# Patient Record
Sex: Female | Born: 1990 | Race: White | Hispanic: No | Smoking: Never smoker
Health system: Southern US, Community
[De-identification: ages and names within clinical notes are randomized; demographics above are authoritative.]

## PROBLEM LIST (undated history)

## (undated) ENCOUNTER — Inpatient Hospital Stay (HOSPITAL_COMMUNITY): Payer: Self-pay

## (undated) DIAGNOSIS — G43909 Migraine, unspecified, not intractable, without status migrainosus: Secondary | ICD-10-CM

## (undated) DIAGNOSIS — Z8619 Personal history of other infectious and parasitic diseases: Secondary | ICD-10-CM

## (undated) HISTORY — PX: DILATION AND CURETTAGE OF UTERUS: SHX78

---
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
## 2010-10-12 ENCOUNTER — Encounter: Payer: Self-pay | Admitting: Obstetrics and Gynecology

## 2011-05-08 ENCOUNTER — Inpatient Hospital Stay (HOSPITAL_COMMUNITY)
Admission: AD | Admit: 2011-05-08 | Discharge: 2011-05-08 | Disposition: A | Discharge status: Home / Self Care | Payer: Medicaid Other | Source: Ambulatory Visit | Attending: Obstetrics & Gynecology | Admitting: Obstetrics & Gynecology

## 2011-05-08 ENCOUNTER — Encounter (HOSPITAL_COMMUNITY): Payer: Self-pay

## 2011-05-08 DIAGNOSIS — O99891 Other specified diseases and conditions complicating pregnancy: Secondary | ICD-10-CM | POA: Insufficient documentation

## 2011-05-08 DIAGNOSIS — Z331 Pregnant state, incidental: Secondary | ICD-10-CM

## 2011-05-08 DIAGNOSIS — M79609 Pain in unspecified limb: Secondary | ICD-10-CM

## 2011-05-08 LAB — HEPATITIS B SURFACE ANTIGEN: Hepatitis B Surface Ag: NEGATIVE

## 2011-05-08 LAB — RUBELLA ANTIBODY, IGM: Rubella: IMMUNE

## 2011-05-08 LAB — ABO/RH: RH Type: NEGATIVE

## 2011-05-08 LAB — HIV ANTIBODY (ROUTINE TESTING W REFLEX): HIV: NONREACTIVE

## 2011-05-08 LAB — RPR: RPR: NONREACTIVE

## 2011-05-08 NOTE — ED Notes (Signed)
Portable ultrasound performed at bedside by Henrietta Hoover PA

## 2011-05-08 NOTE — ED Provider Notes (Addendum)
History   Pt presents today c/o grabbing and electric fence yesterday. She states she accidentally touched the fence while her other hand was in a bucket of water. The shock knocked her backwards but she did not hit the ground. She denies chest pain, SOB, abd pain, vag bleeding, or any other sx at this time. She states she was told to come to the MAU for an [place]. She also reports that she has had pain in her left calf. She states it "feels like there is a knot there." She denies erythema or edema.  No chief complaint on file.  HPI  OB History    Grav Para Term Preterm Abortions TAB SAB Ect Mult Living   3 1 1  1  1   1       Past Medical History  Diagnosis Date  . No pertinent past medical history     No past surgical history on file.  No family history on file.  History  Substance Use Topics  . Smoking status: Not on file  . Smokeless tobacco: Not on file  . Alcohol Use: Not on file    Allergies: Allergies not on file  No prescriptions prior to admission    Review of Systems  Constitutional: Negative for fever and chills.  Eyes: Negative for blurred vision.  Cardiovascular: Negative for chest pain and palpitations.  Gastrointestinal: Negative for nausea, vomiting, abdominal pain, diarrhea and constipation.  Genitourinary: Negative for dysuria, urgency, frequency, hematuria and flank pain.  Neurological: Negative for dizziness, tingling, seizures and headaches.  Psychiatric/Behavioral: Negative for depression and suicidal ideas.   Physical Exam   Blood pressure 106/63, pulse 80, temperature 98.4 F (36.9 C), temperature source Oral, resp. rate 16, height 5' 3.5" (1.613 m), weight 135 lb 3.2 oz (61.326 kg), SpO2 100.00%.  Physical Exam  Constitutional: She is oriented to person, place, and time. She appears well-developed and well-nourished. No distress.  HENT:  Head: Normocephalic and atraumatic.  Eyes: EOM are normal. Pupils are equal, round, and reactive to light.    GI: Soft. She exhibits no distension and no mass. There is no tenderness. There is no rebound and no guarding.  Musculoskeletal:       Left lower leg: She exhibits tenderness. She exhibits no swelling, no edema and no deformity.       Legs: Neurological: She is alert and oriented to person, place, and time.  Skin: Skin is warm and dry. She is not diaphoretic.  Psychiatric: She has a normal mood and affect. Her behavior is normal. Judgment and thought content normal.    MAU Course  Procedures  Discussed pt with Dr. Tamela Oddi. She advised to do bedside US for fetal viability. If NL then may have pt f/u in office.  Lower extremity doppler study ordered secondary to left calf pain.  Lower extremity doppler study was negative with no evidence of DVT.  Assessment and Plan  Pt has f/u scheduled. Discussed diet, activity, risks, and precautions.  Clinton Gallant. Aedyn Mckeon III, DrHSc, MPAS, PA-C  05/08/2011, 11:03 AM

## 2011-05-08 NOTE — Progress Notes (Signed)
Patient states that on 8-16 she was watering horses and grabbed an electric fence while her hand was in water. Was shocked and throw backwards. Felt like she had a "shock" in her heart. Went to the office today and was instructed to come to MAU and have an ultrasound. Pt is not having any problems at this time.

## 2011-05-11 ENCOUNTER — Other Ambulatory Visit: Payer: Self-pay

## 2011-05-11 ENCOUNTER — Observation Stay (HOSPITAL_COMMUNITY)
Admission: AD | Admit: 2011-05-11 | Discharge: 2011-05-14 | Disposition: A | Discharge status: Home / Self Care | Payer: Medicaid Other | Source: Ambulatory Visit | Attending: Obstetrics | Admitting: Obstetrics

## 2011-05-11 ENCOUNTER — Encounter (HOSPITAL_COMMUNITY): Payer: Self-pay | Admitting: *Deleted

## 2011-05-11 DIAGNOSIS — O26899 Other specified pregnancy related conditions, unspecified trimester: Secondary | ICD-10-CM | POA: Diagnosis present

## 2011-05-11 DIAGNOSIS — G43909 Migraine, unspecified, not intractable, without status migrainosus: Secondary | ICD-10-CM | POA: Insufficient documentation

## 2011-05-11 DIAGNOSIS — O99891 Other specified diseases and conditions complicating pregnancy: Principal | ICD-10-CM | POA: Insufficient documentation

## 2011-05-11 DIAGNOSIS — R519 Headache, unspecified: Secondary | ICD-10-CM

## 2011-05-11 HISTORY — DX: Migraine, unspecified, not intractable, without status migrainosus: G43.909

## 2011-05-11 LAB — COMPREHENSIVE METABOLIC PANEL
ALT: 7 U/L (ref 0–35)
AST: 14 U/L (ref 0–37)
Albumin: 3.2 g/dL — ABNORMAL LOW (ref 3.5–5.2)
Alkaline Phosphatase: 64 U/L (ref 39–117)
BUN: 6 mg/dL (ref 6–23)
CO2: 25 mEq/L (ref 19–32)
Calcium: 9.1 mg/dL (ref 8.4–10.5)
Chloride: 104 mEq/L (ref 96–112)
Creatinine, Ser: <0.47 mg/dL — ABNORMAL LOW (ref 0.50–1.10)
Glucose, Bld: 96 mg/dL (ref 70–99)
Potassium: 4 mEq/L (ref 3.5–5.1)
Sodium: 135 mEq/L (ref 135–145)
Total Bilirubin: 0.1 mg/dL — ABNORMAL LOW (ref 0.3–1.2)
Total Protein: 6.8 g/dL (ref 6.0–8.3)

## 2011-05-11 LAB — URINALYSIS, ROUTINE W REFLEX MICROSCOPIC
Bilirubin Urine: NEGATIVE
Glucose, UA: >1000 mg/dL — AB
Hgb urine dipstick: NEGATIVE
Ketones, ur: 40 mg/dL — AB
Nitrite: NEGATIVE
Protein, ur: NEGATIVE mg/dL
Specific Gravity, Urine: 1.015 (ref 1.005–1.030)
Urobilinogen, UA: 0.2 mg/dL (ref 0.0–1.0)
pH: 8.5 — ABNORMAL HIGH (ref 5.0–8.0)

## 2011-05-11 LAB — CBC
HCT: 34.5 % — ABNORMAL LOW (ref 36.0–46.0)
Hemoglobin: 11.9 g/dL — ABNORMAL LOW (ref 12.0–15.0)
MCH: 28.5 pg (ref 26.0–34.0)
MCHC: 34.5 g/dL (ref 30.0–36.0)
MCV: 82.7 fL (ref 78.0–100.0)
Platelets: 273 10*3/uL (ref 150–400)
RBC: 4.17 MIL/uL (ref 3.87–5.11)
RDW: 15 % (ref 11.5–15.5)
WBC: 12 10*3/uL — ABNORMAL HIGH (ref 4.0–10.5)

## 2011-05-11 LAB — URINE MICROSCOPIC-ADD ON

## 2011-05-11 MED ORDER — LACTATED RINGERS IV SOLN
INTRAVENOUS | Status: DC
  Administered 2011-05-11: 16:00:00 via INTRAVENOUS

## 2011-05-11 MED ORDER — BUTALBITAL-APAP-CAFFEINE 50-325-40 MG PO TABS
2.0000 | ORAL_TABLET | Freq: Four times a day (QID) | ORAL | Status: DC | PRN
  Administered 2011-05-12 – 2011-05-13 (×6): 2 via ORAL
  Filled 2011-05-11 (×3): qty 2

## 2011-05-11 MED ORDER — MORPHINE SULFATE 10 MG/ML IJ SOLN
10.0000 mg | Freq: Once | INTRAMUSCULAR | Status: AC
  Administered 2011-05-11: 10 mg via INTRAMUSCULAR
  Filled 2011-05-11: qty 1

## 2011-05-11 MED ORDER — DEXTROSE-NACL 5-0.45 % IV SOLN
INTRAVENOUS | Status: DC
  Administered 2011-05-11 – 2011-05-14 (×8): via INTRAVENOUS

## 2011-05-11 MED ORDER — ZOLPIDEM TARTRATE 10 MG PO TABS
10.0000 mg | ORAL_TABLET | Freq: Every evening | ORAL | Status: DC | PRN
  Administered 2011-05-11 – 2011-05-13 (×3): 10 mg via ORAL
  Filled 2011-05-11 (×2): qty 1

## 2011-05-11 MED ORDER — PRENATAL PLUS 27-1 MG PO TABS
1.0000 | ORAL_TABLET | Freq: Every day | ORAL | Status: DC
  Administered 2011-05-11 – 2011-05-14 (×4): 1 via ORAL
  Filled 2011-05-11 (×5): qty 1

## 2011-05-11 MED ORDER — DEXAMETHASONE SODIUM PHOSPHATE 10 MG/ML IJ SOLN
10.0000 mg | Freq: Once | INTRAMUSCULAR | Status: AC
  Administered 2011-05-11: 10 mg via INTRAVENOUS
  Filled 2011-05-11: qty 1

## 2011-05-11 MED ORDER — DOCUSATE SODIUM 100 MG PO CAPS
100.0000 mg | ORAL_CAPSULE | Freq: Every day | ORAL | Status: DC
  Administered 2011-05-11 – 2011-05-13 (×3): 100 mg via ORAL
  Filled 2011-05-11 (×6): qty 1

## 2011-05-11 MED ORDER — DIPHENHYDRAMINE HCL 50 MG/ML IJ SOLN
25.0000 mg | Freq: Once | INTRAMUSCULAR | Status: AC
  Administered 2011-05-11: 25 mg via INTRAVENOUS
  Filled 2011-05-11: qty 1

## 2011-05-11 MED ORDER — ACETAMINOPHEN 325 MG PO TABS
650.0000 mg | ORAL_TABLET | ORAL | Status: DC | PRN
  Administered 2011-05-11: 650 mg via ORAL
  Filled 2011-05-11 (×2): qty 2

## 2011-05-11 MED ORDER — CALCIUM CARBONATE ANTACID 500 MG PO CHEW
2.0000 | CHEWABLE_TABLET | ORAL | Status: DC | PRN
  Administered 2011-05-13: 400 mg via ORAL
  Filled 2011-05-11 (×2): qty 2

## 2011-05-11 MED ORDER — PROCHLORPERAZINE EDISYLATE 5 MG/ML IJ SOLN
10.0000 mg | Freq: Once | INTRAMUSCULAR | Status: DC
  Filled 2011-05-11: qty 2

## 2011-05-11 MED ORDER — PROMETHAZINE HCL 25 MG/ML IJ SOLN
12.5000 mg | Freq: Once | INTRAMUSCULAR | Status: AC
  Administered 2011-05-11: 12.5 mg via INTRAVENOUS
  Filled 2011-05-11: qty 1

## 2011-05-11 NOTE — H&P (Signed)
Victoria Humphrey is a 20 y.o. female  G3 P1 at [redacted] weeks gestation presenting for severe headaches. Maternal Medical History:  Reason for admission: Reason for admission: nausea.  C/o severe headache at [redacted] weeks gestation.  Contractions: none  Fetal activity: none  Prenatal complications: no prenatal complications Headaches started with this pregnancy.  Prenatal Complications - Diabetes: none  OB History    Grav Para Term Preterm Abortions TAB SAB Ect Mult Living   3 1 1  1  1   1      Past Medical History  Diagnosis Date  . Migraine headache    History reviewed. No pertinent past surgical history. Family History: family history is not on file. Social History:  does not have a smoking history on file. She does not have any smokeless tobacco history on file. She reports that she does not drink alcohol or use illicit drugs.  Review of Systems  Eyes: Positive for blurred vision and double vision.  Gastrointestinal: Positive for nausea.  Neurological: Positive for dizziness and headaches.  All other systems reviewed and are negative.      Blood pressure 90/59, pulse 76, temperature 98.5 F (36.9 C), temperature source Oral, resp. rate 18, height 5\' 2"  (1.575 m), weight 61.417 kg (135 lb 6.4 oz). Maternal Exam:  Abdomen: Patient reports no abdominal tenderness.   Physical Exam  Nursing note and vitals reviewed. Constitutional: She is oriented to person, place, and time. She appears well-developed and well-nourished.  HENT:  Head: Normocephalic and atraumatic.  Eyes: Conjunctivae and EOM are normal. Pupils are equal, round, and reactive to light.  Neck: Normal range of motion. Neck supple.  Cardiovascular: Normal rate and regular rhythm.   Respiratory: Effort normal.  GI: Soft.  Musculoskeletal: Normal range of motion.  Neurological: She is alert and oriented to person, place, and time. She has normal reflexes.  Skin: Skin is warm and dry.  Psychiatric: She has a normal  mood and affect. Her behavior is normal. Judgment and thought content normal.    Prenatal labs: ABO, Rh:   Antibody:   Rubella:   RPR:    HBsAg:    HIV:    GBS:     Assessment/Plan: [name] at [redacted] weeks gestation.  Severe headaches that started with this pregnancy, and has worsened over the course of this pregnancy.  The headache did not respond to the standard HA management of IVF's, Decadron IV 10 mg, Benadryl IV 25 mg and Phenergan IV 12.5 mg.  Neurology consulted for further management.  Patient admitted for observation.   Bryce Cheever A 05/11/2011, 5:50 PM

## 2011-05-11 NOTE — Progress Notes (Signed)
Ongoing headaches, started 2 months ago. Getting worse. Has passed out at work.

## 2011-05-11 NOTE — ED Provider Notes (Signed)
Victoria Humphrey is a 20 y.o. female at 16.1 weeks gesteation presenting for severe, recurrent HA's and syncopal episodes since this pregnancy started. She denies prior similar HAs. She has not been evaluated by a neurologist. She denies difficulty w/ speech or gait, numbness, weakness. The pain is constant sharp right frontal associated w/ black spots in both fields of vision. She reports normal appetite and PO intake. Dr. Clearance Coots ordered IV bolus, Decadron, phernergan History OB History    Grav Para Term Preterm Abortions TAB SAB Ect Mult Living   3 1 1  1  1   1      Past Medical History  Diagnosis Date  . No pertinent past medical history    No past surgical history on file. Family History: family history is not on file. Social History:  does not have a smoking history on file. She does not have any smokeless tobacco history on file. Her alcohol and drug histories not on file.  Review of Systems  Neurological: Positive for loss of consciousness. Negative for dizziness, sensory change, speech change, focal weakness and weakness.  Psychiatric/Behavioral: Negative for hallucinations and memory loss.  All other systems reviewed and are negative.      Blood pressure 102/64, pulse 83, temperature 98.5 F (36.9 C), temperature source Oral, resp. rate 18, height 5\' 2"  (1.575 m), weight 61.417 kg (135 lb 6.4 oz). Maternal Exam:  Introitus: not evaluated.     Physical Exam  Constitutional: She is oriented to person, place, and time. She appears well-developed and well-nourished. She appears distressed.  HENT:  Head: Normocephalic and atraumatic.  Eyes: Pupils are equal, round, and reactive to light.  Cardiovascular: Normal rate.   Respiratory: Effort normal.  Neurological: She is alert and oriented to person, place, and time. She has normal strength and normal reflexes. No cranial nerve deficit or sensory deficit.  Skin: Skin is warm and dry.  Psychiatric: She has a normal mood and affect.    FHT's + Patient Vitals for the past 24 hrs:  BP Temp Temp src Pulse Resp Height Weight  05/11/11 1600 90/59 mmHg - - 76  18  - -  05/11/11 1558 96/67 mmHg - - 83  18  - -  05/11/11 1554 102/64 mmHg - - 83  18  - -  05/11/11 1455 103/57 mmHg 98.5 F (36.9 C) Oral 79  20  5\' 2"  (1.575 m) 61.417 kg (135 lb 6.4 oz)   Results for orders placed during the hospital encounter of 05/11/11 (from the past 24 hour(s))  CBC     Status: Abnormal   Collection Time   05/11/11  6:00 PM      Component Value Range   WBC 12.0 (*) 4.0 - 10.5 (K/uL)   RBC 4.17  3.87 - 5.11 (MIL/uL)   Hemoglobin 11.9 (*) 12.0 - 15.0 (g/dL)   HCT 78.2 (*) 95.6 - 46.0 (%)   MCV 82.7  78.0 - 100.0 (fL)   MCH 28.5  26.0 - 34.0 (pg)   MCHC 34.5  30.0 - 36.0 (g/dL)   RDW 21.3  08.6 - 57.8 (%)   Platelets 273  150 - 400 (K/uL)  COMPREHENSIVE METABOLIC PANEL     Status: Abnormal   Collection Time   05/11/11  6:00 PM      Component Value Range   Sodium 135  135 - 145 (mEq/L)   Potassium 4.0  3.5 - 5.1 (mEq/L)   Chloride 104  96 - 112 (mEq/L)  CO2 25  19 - 32 (mEq/L)   Glucose, Bld 96  70 - 99 (mg/dL)   BUN 6  6 - 23 (mg/dL)   Creatinine, Ser <1.61 (*) 0.50 - 1.10 (mg/dL)   Calcium 9.1  8.4 - 09.6 (mg/dL)   Total Protein 6.8  6.0 - 8.3 (g/dL)   Albumin 3.2 (*) 3.5 - 5.2 (g/dL)   AST 14  0 - 37 (U/L)   ALT 7  0 - 35 (U/L)   Alkaline Phosphatase 64  39 - 117 (U/L)   Total Bilirubin 0.1 (*) 0.3 - 1.2 (mg/dL)   GFR calc non Af Amer NOT CALCULATED  >60 (mL/min)   GFR calc Af Amer NOT CALCULATED  >60 (mL/min)  URINALYSIS, ROUTINE W REFLEX MICROSCOPIC     Status: Abnormal   Collection Time   05/11/11  8:45 PM      Component Value Range   Color, Urine YELLOW  YELLOW    Appearance CLEAR  CLEAR    Specific Gravity, Urine 1.015  1.005 - 1.030    pH 8.5 (*) 5.0 - 8.0    Glucose, UA >1000 (*) NEGATIVE (mg/dL)   Hgb urine dipstick NEGATIVE  NEGATIVE    Bilirubin Urine NEGATIVE  NEGATIVE    Ketones, ur 40 (*) NEGATIVE (mg/dL)    Protein, ur NEGATIVE  NEGATIVE (mg/dL)   Urobilinogen, UA 0.2  0.0 - 1.0 (mg/dL)   Nitrite NEGATIVE  NEGATIVE    Leukocytes, UA SMALL (*) NEGATIVE   URINE MICROSCOPIC-ADD ON     Status: Abnormal   Collection Time   05/11/11  8:45 PM      Component Value Range   Squamous Epithelial / LPF MANY (*) RARE    WBC, UA 3-6  <3 (WBC/hpf)   Bacteria, UA MANY (*) RARE    Urine-Other MUCOUS PRESENT    ECG: NSR  Prenatal labs: ABO, Rh:   Antibody:   Rubella:   RPR:    HBsAg:    HIV:    GBS:     Assessment/Plan: 1. Pregnancy-associated HA and syncopal episodes of unknown etiology. HA not responsive to meds and IV fluids   2. 16.1 week IUP  Plan: 1. Admit for 23-hour obs per consult w/ Dr. Clearance Coots 2. Neuro consult  Dorathy Kinsman 05/11/2011, 3:55 PM

## 2011-05-12 DIAGNOSIS — O26899 Other specified pregnancy related conditions, unspecified trimester: Secondary | ICD-10-CM | POA: Diagnosis present

## 2011-05-12 DIAGNOSIS — R519 Headache, unspecified: Secondary | ICD-10-CM | POA: Diagnosis present

## 2011-05-12 MED ORDER — PREDNISONE (PAK) 5 MG PO TABS: 5.0000 mg | ORAL_TABLET | ORAL | Status: DC

## 2011-05-12 MED ORDER — PREDNISONE (PAK) 5 MG PO TABS
5.0000 mg | ORAL_TABLET | ORAL | Status: AC
  Administered 2011-05-12: 5 mg via ORAL

## 2011-05-12 MED ORDER — PREDNISONE (PAK) 5 MG PO TABS
5.0000 mg | ORAL_TABLET | Freq: Three times a day (TID) | ORAL | Status: AC
  Administered 2011-05-13 (×3): 5 mg via ORAL

## 2011-05-12 MED ORDER — PREDNISONE (PAK) 5 MG PO TABS
10.0000 mg | ORAL_TABLET | Freq: Every evening | ORAL | Status: AC
  Administered 2011-05-13: 10 mg via ORAL

## 2011-05-12 MED ORDER — PREDNISONE (PAK) 5 MG PO TABS
15.0000 mg | ORAL_TABLET | Freq: Once | ORAL | Status: AC
  Filled 2011-05-12: qty 21

## 2011-05-12 MED ORDER — PROMETHAZINE HCL 25 MG/ML IJ SOLN
25.0000 mg | Freq: Once | INTRAMUSCULAR | Status: AC
  Administered 2011-05-12: 25 mg via INTRAMUSCULAR
  Filled 2011-05-12: qty 1

## 2011-05-12 MED ORDER — PREDNISONE (PAK) 5 MG PO TABS
5.0000 mg | ORAL_TABLET | Freq: Four times a day (QID) | ORAL | Status: DC
  Administered 2011-05-13 – 2011-05-14 (×2): 5 mg via ORAL

## 2011-05-12 MED ORDER — DIPHENHYDRAMINE HCL 25 MG PO CAPS
25.0000 mg | ORAL_CAPSULE | Freq: Four times a day (QID) | ORAL | Status: DC | PRN
  Administered 2011-05-12: 25 mg via ORAL
  Filled 2011-05-12: qty 1

## 2011-05-12 MED ORDER — PREDNISONE (PAK) 5 MG PO TABS
10.0000 mg | ORAL_TABLET | Freq: Every evening | ORAL | Status: AC
  Administered 2011-05-12: 10 mg via ORAL

## 2011-05-12 MED ORDER — MORPHINE SULFATE 10 MG/ML IJ SOLN
10.0000 mg | Freq: Once | INTRAMUSCULAR | Status: AC
  Administered 2011-05-12: 10 mg via INTRAMUSCULAR
  Filled 2011-05-12: qty 1

## 2011-05-12 MED ORDER — PREDNISONE (PAK) 5 MG PO TABS
15.0000 mg | ORAL_TABLET | Freq: Once | ORAL | Status: AC
  Administered 2011-05-12: 15 mg via ORAL
  Filled 2011-05-12: qty 21

## 2011-05-12 NOTE — Consult Note (Signed)
Victoria Humphrey, Victoria Humphrey                 ACCOUNT NO.:  [id number]  MEDICAL RECORD NO.:  [id number]  LOCATION:                                 FACILITY:  PHYSICIAN:  Acel Natzke P. Pearlean Brownie, MD    DATE OF BIRTH:  [birth year]  DATE OF CONSULTATION: DATE OF DISCHARGE:                                CONSULTATION   REFERRING PHYSICIAN:  Charles A. Clearance Coots, MD  REASON FOR REFERRAL:  Headache and syncope.  HISTORY OF PRESENT ILLNESS:  Victoria Humphrey is a 20 year old Caucasian lady who is 16 weeks' pregnant.  For the last 2 months she has been having intermittent new onset of right frontal headaches as well as few episodes of passing out and syncope.  She describes the headache as starting in the right frontal region, moderate to severe in intensity at times 10/10 accompanied by nausea and light and sound sensitivity.  The headache will last for several hours.  She is unable to identify specific triggers except for stress.  She denies any previous history of migraine headaches or family history of migraines either.  She has tried Tylenol which has not helped.  She denies any symptoms of double vision, vertigo, focal extremity weakness, numbness or balance problems.  She states she has passed out on 4 occasions and each of these the headaches was quite severe.  She tried to sit down but fell and was briefly out for not more than a minute and when she woke up she knew exactly what had happened.  Her mind was clear but she felt tired.  She denies any tongue bite injury, jerking of the extremities.  She has no other significant past medical history.  This is her third pregnancy.  Home medications are none except prenatal vitamins.  PHYSICAL EXAMINATION:  GENERAL:  A frail young Caucasian lady who is currently not in distress. VITAL SIGNS:  Pulse rate is 78 per minute, regular sinus.  Respiratory rate 16 per minute, distal pulse is well felt. HEENT:  Head is nontraumatic.  Hearing is normal. NECK:  Supple.   There is no bruit. CARDIAC:  No murmur or gallop. LUNGS:  Clear to auscultation. ABDOMEN:  Soft, nontender. NEUROLOGIC:  She is pleasant, awake, alert, cooperative.  Speech and language appears normal.  Eye movements are full range without nystagmus.  Face is symmetric.  Palatal movements are normal.  Tongue is midline.  Motor system exam reveals no upper or lower extremity drift, symmetric and equal strength in all 4 extremities.  She walks a slow steady gait.  Data reviewed none.  IMPRESSION:  A 20-year lady with new onset of right frontal hemicranial headaches with few episodes of syncope, likely migraine-related syncope. Unusual features being persistent unilateral nature of the headaches and lack of previous past history or family history.  The patient is 16 weeks' pregnant.  PLAN:  I would recommend trying Tylenol with Codeine or Fioricet for symptomatic relief.  If the headaches frequency increases more than 3-4 times per week, may need to consider low-dose beta-blocker as well as prophylaxis.  At some point the patient needs a brain imaging study, but I would like to avoid it during pregnancy  if possible.  I have advised the patient to participate in stress relaxation activities and avoid stress, maintain regular sleeping, eating cycles as well as drink plenty of fluids and get lot of rest.  I gave the patient opportunity to ask questions which were answered.  I discussed the case with Dr. Clearance Coots as well.  The patient may follow up with me electively in the office as outpatient if needed in the future.     Antonie Borjon P. Pearlean Brownie, MD     PPS/MEDQ  D:  05/11/2011  T:  05/12/2011  Job:  [id number]

## 2011-05-12 NOTE — Progress Notes (Signed)
UR Chart review completed.  

## 2011-05-13 ENCOUNTER — Encounter (HOSPITAL_COMMUNITY): Payer: Self-pay | Admitting: Obstetrics

## 2011-05-13 MED ORDER — POLYETHYLENE GLYCOL 3350 17 G PO PACK
17.0000 g | PACK | Freq: Once | ORAL | Status: AC
  Administered 2011-05-13: 17 g via ORAL
  Filled 2011-05-13: qty 1

## 2011-05-13 NOTE — Progress Notes (Signed)
  S: Preterm labor symptoms: none  O: Blood pressure 103/68, pulse 88, temperature 98.4 F (36.9 C), temperature source Oral, resp. rate 18, height 5\' 2"  (1.575 m), weight 61.417 kg (135 lb 6.4 oz), SpO2 98.00%.   UJW:[id number]: 150 bpm Toco: none SVE: n/a  A/P- 20 y.o. admitted with headache Preterm labor management: no treatment necessary Dating:  [redacted]w[redacted]d PNL Needed:  none FWB:  good PTL:  none ROD: n/a

## 2011-05-14 LAB — GLUCOSE, CAPILLARY: Glucose-Capillary: 109 mg/dL — ABNORMAL HIGH (ref 70–99)

## 2011-05-14 MED ORDER — BUTALBITAL-APAP-CAFFEINE 50-325-40 MG PO TABS: 2.0000 | ORAL_TABLET | Freq: Four times a day (QID) | ORAL | Status: DC | PRN

## 2011-05-14 MED ORDER — PREDNISONE (PAK) 5 MG PO TABS: 5.0000 mg | ORAL_TABLET | Freq: Four times a day (QID) | ORAL | Status: AC

## 2011-05-14 NOTE — Progress Notes (Signed)
  S: Preterm labor symptoms: none  O: Blood pressure 107/70, pulse 76, temperature 98.4 F (36.9 C), temperature source Oral, resp. rate 18, height 5\' 2"  (1.575 m), weight 61.417 kg (135 lb 6.4 oz), SpO2 97.00%.   ZOX:[id number]: 150 bpm Toco: None SVE: n/a  A/P- 20 y.o. admitted with severe migraine.  Improved after medical management.                                                                                                                                                                                                                                                     Dating:  [redacted]w[redacted]d PNL Needed: none FWB:  good PTL:  none

## 2011-06-14 ENCOUNTER — Encounter (HOSPITAL_COMMUNITY): Payer: Self-pay | Admitting: *Deleted

## 2011-06-14 ENCOUNTER — Inpatient Hospital Stay (HOSPITAL_COMMUNITY)
Admission: AD | Admit: 2011-06-14 | Discharge: 2011-06-15 | Disposition: A | Discharge status: Home / Self Care | Payer: Medicaid Other | Source: Ambulatory Visit | Attending: Obstetrics & Gynecology | Admitting: Obstetrics & Gynecology

## 2011-06-14 DIAGNOSIS — N949 Unspecified condition associated with female genital organs and menstrual cycle: Secondary | ICD-10-CM | POA: Insufficient documentation

## 2011-06-14 DIAGNOSIS — O2692 Pregnancy related conditions, unspecified, second trimester: Secondary | ICD-10-CM

## 2011-06-14 DIAGNOSIS — O2690 Pregnancy related conditions, unspecified, unspecified trimester: Secondary | ICD-10-CM

## 2011-06-14 DIAGNOSIS — O99891 Other specified diseases and conditions complicating pregnancy: Secondary | ICD-10-CM | POA: Insufficient documentation

## 2011-06-14 DIAGNOSIS — R102 Pelvic and perineal pain: Secondary | ICD-10-CM

## 2011-06-14 LAB — URINE MICROSCOPIC-ADD ON

## 2011-06-14 LAB — WET PREP, GENITAL
Trich, Wet Prep: NONE SEEN
Yeast Wet Prep HPF POC: NONE SEEN

## 2011-06-14 LAB — URINALYSIS, ROUTINE W REFLEX MICROSCOPIC
Bilirubin Urine: NEGATIVE
Glucose, UA: NEGATIVE mg/dL
Hgb urine dipstick: NEGATIVE
Ketones, ur: NEGATIVE mg/dL
Nitrite: NEGATIVE
Protein, ur: NEGATIVE mg/dL
Specific Gravity, Urine: 1.015 (ref 1.005–1.030)
Urobilinogen, UA: 0.2 mg/dL (ref 0.0–1.0)
pH: 6.5 (ref 5.0–8.0)

## 2011-06-14 NOTE — Progress Notes (Signed)
Pt G3 P1 at 21wks, small amt of bright red bleeding today around 1500 with sharp vaginal pain.  No bleeding at this time, vaginal pain continues.

## 2011-06-14 NOTE — ED Provider Notes (Signed)
History     CSN: [id number] Arrival date & time: 06/14/2011  9:02 PM  Chief Complaint  Patient presents with  . Vaginal Bleeding       HPI:   Victoria Humphrey is a 20 y.o. female @ 21 weeks gesation who presents to MAU for sharpe pains in the vaginal area. She states that she also saw pink on the tissue when she wiped once tonight but none since. The history was provided by the patient.  Past Medical History  Diagnosis Date  . Migraine headache     Past Surgical History  Procedure Date  . Dilation and curettage of uterus     No family history on file.  History  Substance Use Topics  . Smoking status: Never Smoker   . Smokeless tobacco: Not on file  . Alcohol Use: No    OB History    Grav Para Term Preterm Abortions TAB SAB Ect Mult Living   3 1 1  1  1   1       Review of Systems  Review of Systems  Constitutional: Negative for fever, chills, diaphoresis and fatigue.  HENT: Positive for congestion. Negative for ear pain, sore throat, facial swelling, neck pain, neck stiffness, dental problem and sinus pressure.   Eyes: Negative for photophobia, pain and discharge.  Respiratory: Positive for cough. Negative for chest tightness and wheezing.   Cardiovascular: Negative.   Gastrointestinal: Negative for nausea, vomiting, abdominal pain, diarrhea, constipation and abdominal distention.  Genitourinary: Positive for frequency, vaginal bleeding and vaginal pain. Negative for dysuria, flank pain and difficulty urinating.       Pressure with urination  Musculoskeletal: Positive for back pain. Negative for myalgias and gait problem.  Skin: Negative for color change and rash.  Neurological: Negative for dizziness, speech difficulty, weakness, light-headedness, numbness and headaches.  Psychiatric/Behavioral: Negative for confusion and agitation.    Allergies  Review of patient's allergies indicates no known allergies.  Home Medications  No current outpatient prescriptions on  file.  Physical Exam    BP 116/67  Pulse 103  Temp(Src) 97.8 F (36.6 C) (Oral)  Resp 16  Ht 5\' 2"  (1.575 m)  Wt 149 lb 3.2 oz (67.677 kg)  BMI 27.29 kg/m2  Physical Exam  Nursing note and vitals reviewed. Constitutional: She is oriented to person, place, and time. She appears well-developed and well-nourished.  Eyes: EOM are normal.  Neck: Neck supple.  Cardiovascular: Normal rate.   Pulmonary/Chest: Effort normal.  Abdominal: Soft. There is no tenderness.       Gravid @ [redacted] weeks gestation. Positive FHT.  Genitourinary:       No CVA tenderness.  Yellow mucous discharge, no blood noted. Cervix closed. Uterus consistent with dates.  Musculoskeletal: Normal range of motion.  Neurological: She is alert and oriented to person, place, and time. No cranial nerve deficit.  Skin: Skin is warm and dry.  Psychiatric: She has a normal mood and affect.    ED Course  Procedures  Results for orders placed during the hospital encounter of 06/14/11 (from the past 24 hour(s))  URINALYSIS, ROUTINE W REFLEX MICROSCOPIC     Status: Abnormal   Collection Time   06/14/11  9:07 PM      Component Value Range   Color, Urine YELLOW  YELLOW    Appearance CLEAR  CLEAR    Specific Gravity, Urine 1.015  1.005 - 1.030    pH 6.5  5.0 - 8.0    Glucose, UA  NEGATIVE  NEGATIVE (mg/dL)   Hgb urine dipstick NEGATIVE  NEGATIVE    Bilirubin Urine NEGATIVE  NEGATIVE    Ketones, ur NEGATIVE  NEGATIVE (mg/dL)   Protein, ur NEGATIVE  NEGATIVE (mg/dL)   Urobilinogen, UA 0.2  0.0 - 1.0 (mg/dL)   Nitrite NEGATIVE  NEGATIVE    Leukocytes, UA SMALL (*) NEGATIVE   URINE MICROSCOPIC-ADD ON     Status: Abnormal   Collection Time   06/14/11  9:07 PM      Component Value Range   Squamous Epithelial / LPF RARE  RARE    WBC, UA 0-2  <3 (WBC/hpf)   Bacteria, UA FEW (*) RARE   WET PREP, GENITAL     Status: Abnormal   Collection Time   06/14/11 11:00 PM      Component Value Range   Yeast, Wet Prep NONE SEEN  NONE  SEEN    Trich, Wet Prep NONE SEEN  NONE SEEN    Clue Cells, Wet Prep FEW (*) NONE SEEN    WBC, Wet Prep HPF POC MODERATE (*) NONE SEEN    Assessment: Vaginal pain  Plan:  Discussed with patient that we have sent cultures for GC, Chlamydia and Urine   Discussed round ligament pain at [redacted] weeks gestation   Patient to call the office in the morning for follow up.      [place], [place] 06/14/11 [phone]

## 2011-06-14 NOTE — Progress Notes (Signed)
Pt reports vaginal bleeding at 15:30. Along with bleed came vaginal pain. Pt denies further bleeding since, but the pain comes and goes.

## 2011-06-16 LAB — GC/CHLAMYDIA PROBE AMP, GENITAL
Chlamydia, DNA Probe: NEGATIVE
GC Probe Amp, Genital: NEGATIVE

## 2011-08-04 ENCOUNTER — Inpatient Hospital Stay (HOSPITAL_COMMUNITY)
Admission: AD | Admit: 2011-08-04 | Discharge: 2011-08-04 | Disposition: A | Discharge status: Home / Self Care | Payer: Medicaid Other | Source: Ambulatory Visit | Attending: Obstetrics & Gynecology | Admitting: Obstetrics & Gynecology

## 2011-08-04 DIAGNOSIS — Z2989 Encounter for other specified prophylactic measures: Secondary | ICD-10-CM | POA: Insufficient documentation

## 2011-08-04 DIAGNOSIS — O479 False labor, unspecified: Secondary | ICD-10-CM

## 2011-08-04 DIAGNOSIS — O4700 False labor before 37 completed weeks of gestation, unspecified trimester: Secondary | ICD-10-CM | POA: Insufficient documentation

## 2011-08-04 DIAGNOSIS — Z298 Encounter for other specified prophylactic measures: Secondary | ICD-10-CM | POA: Insufficient documentation

## 2011-08-04 MED ORDER — TERBUTALINE SULFATE 1 MG/ML IJ SOLN
0.2500 mg | Freq: Once | INTRAMUSCULAR | Status: AC
  Administered 2011-08-04: 0.25 mg via SUBCUTANEOUS
  Filled 2011-08-04: qty 1

## 2011-08-04 MED ORDER — LACTATED RINGERS IV SOLN
INTRAVENOUS | Status: DC
  Administered 2011-08-04: 21:00:00 via INTRAVENOUS

## 2011-08-04 MED ORDER — RHO D IMMUNE GLOBULIN 1500 UNIT/2ML IJ SOLN
300.0000 ug | Freq: Once | INTRAMUSCULAR | Status: AC
  Administered 2011-08-04: 300 ug via INTRAMUSCULAR
  Filled 2011-08-04: qty 2

## 2011-08-04 NOTE — ED Notes (Signed)
Terbutaline on hold due to patient PO hydrating, no contractions at this time. Order to hold at this time per E. Key NP

## 2011-08-04 NOTE — Progress Notes (Signed)
Patient states she was sent from the office for evaluation of preterm labor. Was seen last week in [facility] with contractions and was given Terbutaline. States her cervix was closed in the office, no leaking or bleeding.

## 2011-08-04 NOTE — ED Provider Notes (Signed)
History     Chief Complaint  Patient presents with  . Contractions   HPILori E HOLLEY20 y.o.[redacted]w[redacted]d G3 P0111.  Sent from the office with orders from the midwife for hydration and Rho gam workup.  Contractions began 3 days ago.  Denies vaginal bleeding or leaking of fluid.  States she was seen 3 days ago at [facility] --1cm and gave terbutaline injection and then today in the office she was told it was closed.  Monitor in the office today per patient  Report contractions every 2-6 minutes.  Reviewed orders patient feels like she can hydrate orally and if not will proceed with IV hydration.     Past Medical History  Diagnosis Date  . Migraine headache     Past Surgical History  Procedure Date  . Dilation and curettage of uterus     No family history on file.  History  Substance Use Topics  . Smoking status: Never Smoker   . Smokeless tobacco: Not on file  . Alcohol Use: No    Allergies: No Known Allergies  Prescriptions prior to admission  Medication Sig Dispense Refill  . calcium carbonate (TUMS - DOSED IN MG ELEMENTAL CALCIUM) 500 MG chewable tablet Chew 1-4 tablets by mouth 2 (two) times daily as needed. For heartburn         Review of Systems  Genitourinary:       + for contractions Neg for vaginal bleeding or leaking of fluid.   Physical Exam   Blood pressure 124/62, pulse 102, temperature 98.3 F (36.8 C), temperature source Oral, resp. rate 16, height 5' 4.5" (1.638 m), weight 160 lb 9.6 oz (72.848 kg), SpO2 99.00%.  Physical Exam  Constitutional: She is oriented to person, place, and time. She appears well-developed and well-nourished. No distress.  HENT:  Head: Normocephalic.  Neck: Normal range of motion.  Cardiovascular: Normal rate.   Respiratory: Effort normal.  GI:       Gravid.  Nontender  Genitourinary:       Exam done in office this afternoon.  No repeated.   Neurological: She is alert and oriented to person, place, and time.  Skin: Skin  is warm and dry.    CERVICAL EXAM BY JENNIFER, RN  AT THE TIME OF DISCHARGE  WAS CLOSED   Results for orders placed during the hospital encounter of 08/04/11 (from the past 24 hour(s))  RH IG WORKUP, [facility]     Status: Normal (Preliminary result)   Collection Time   08/04/11  6:30 PM      Component Value Range   Gestational Age(Wks) 28     ABO/RH(D) O NEG     Antibody Screen NEG     Unit Number [id number]     Blood Component Type RHIG     Unit division 00     Status of Unit ISSUED     Transfusion Status OK TO TRANSFUSE     MAU Course  Procedures  MDM Orders were sent with patient by the CNM.  Patient prefers not to be IV hydrated unless necessary. PO hydration was begun.   FMS--contractions not seen until 19:30.  Discussed with the patient, she feels the contractions.  Will now give Terbutaline X 1.  Rhophylac given.  Patient states she is having increased heart racing with medication, pale.  Contractions have quieted.  Ordered LR 1 liter at 250cc/hr.  Patient also has not eaten.  Will order her food.   21:55 reported patient's hx, sxs  and treatment in MAU including FMS findings.  Order given to discharge to home.   FMS at time of report to Dr. Roselee Culver 135, reactive.  Without uterine contractions for 1 hour.  She is feeling much better having eaten, had IV fluids and no contractions after Terbutaline.    Assessment and Plan  A:  Braxton-Hicks contractions  P:  Instructed patient to stay well hydrated, keep her scheduled appointment and call her doctor for increase in contractions, vaginal bleeding, loss of vaginal fluid or decreased fetal movement.  Tristin Gladman,EVE M 08/04/2011, 6:10 PM   Matt Holmes, NP 08/04/11 2202

## 2011-08-04 NOTE — ED Notes (Signed)
Monitor removed for patient to eat. E. Key NP notified of patients response to medication. No pain at this time, no contractions felt. Plan to let patient eat dinner and will re-check her cervix and notify E. Key NP

## 2011-08-05 LAB — RH IG WORKUP (INCLUDES ABO/RH)
ABO/RH(D): O NEG
Antibody Screen: NEGATIVE
Gestational Age(Wks): 28
Unit division: 0

## 2011-08-11 LAB — GC/CHLAMYDIA PROBE AMP, GENITAL
Chlamydia: NEGATIVE
Gonorrhea: NEGATIVE

## 2011-09-07 NOTE — Discharge Summary (Signed)
Obstetric Discharge Summary Reason for Admission: observation/evaluation Prenatal Procedures: none Intrapartum Procedures: Postpartum Procedures: n/a Complications-Operative and Postpartum: n/a Hemoglobin  Date Value Range Status  05/11/2011 11.9* 12.0-15.0 (g/dL) Final     HCT  Date Value Range Status  05/11/2011 34.5* 36.0-46.0 (%) Final    Discharge Diagnoses: 16 weeks.  Migraine HA.  Discharge Information: Date: 09/07/2011 Activity: unrestricted Diet: routine Medications: PNV Condition: improved Instructions: refer to practice specific booklet and Increase po fluids. Discharge to: home Follow-up Information    Follow up with HARPER,CHARLES A, MD. Make an appointment in 1 week.   Contact information:   [address] [place] [address] [phone]          Newborn Data:  Discharged home undelivered.   HARPER,CHARLES A 09/07/2011, 3:59 PM

## 2011-09-21 LAB — STREP B DNA PROBE: GBS: NEGATIVE

## 2011-09-22 NOTE — L&D Delivery Note (Signed)
Delivery Note At 6:18 PM a viable female was delivered via Vaginal, Spontaneous Delivery (Presentation: Left Occiput Anterior).  APGAR: 8, 9; weight 9 lb 4 oz (4196 g).   Placenta status: Intact, Spontaneous.  Cord: 3 vessels with the following complications: None.  Cord pH: none  Anesthesia: None  Episiotomy: None Lacerations: 2nd degree Suture Repair: 2.0 chromic Est. Blood Loss (mL):   Mom to postpartum.  Baby to nursery-stable.  Esa Raden A 11/03/2011, 6:52 PM

## 2011-09-28 ENCOUNTER — Inpatient Hospital Stay (HOSPITAL_COMMUNITY): Payer: Medicaid Other

## 2011-09-28 ENCOUNTER — Encounter (HOSPITAL_COMMUNITY): Payer: Self-pay | Admitting: *Deleted

## 2011-09-28 ENCOUNTER — Inpatient Hospital Stay (HOSPITAL_COMMUNITY)
Admission: AD | Admit: 2011-09-28 | Discharge: 2011-09-28 | Disposition: A | Discharge status: Home / Self Care | Payer: Medicaid Other | Source: Ambulatory Visit | Attending: Obstetrics & Gynecology | Admitting: Obstetrics & Gynecology

## 2011-09-28 DIAGNOSIS — O99891 Other specified diseases and conditions complicating pregnancy: Secondary | ICD-10-CM | POA: Insufficient documentation

## 2011-09-28 NOTE — Progress Notes (Signed)
Went in for reg scheduled visit.  Last Tues had gone to [employer] thought ROM.  ? In office.  Had continued to have leaking. Sent over for [place]. States BP dropped in office while on monitor.

## 2011-10-27 ENCOUNTER — Encounter (HOSPITAL_COMMUNITY): Payer: Self-pay | Admitting: *Deleted

## 2011-10-27 ENCOUNTER — Telehealth (HOSPITAL_COMMUNITY): Payer: Self-pay | Admitting: *Deleted

## 2011-10-27 NOTE — Telephone Encounter (Signed)
Preadmission screen  

## 2011-10-28 ENCOUNTER — Encounter (HOSPITAL_COMMUNITY): Payer: Self-pay | Admitting: *Deleted

## 2011-10-28 ENCOUNTER — Other Ambulatory Visit: Payer: Self-pay | Admitting: Obstetrics

## 2011-11-03 ENCOUNTER — Encounter (HOSPITAL_COMMUNITY): Payer: Self-pay

## 2011-11-03 ENCOUNTER — Inpatient Hospital Stay (HOSPITAL_COMMUNITY)
Admission: RE | Admit: 2011-11-03 | Discharge: 2011-11-05 | DRG: 775 | Disposition: A | Discharge status: Home / Self Care | Payer: Medicaid Other | Source: Ambulatory Visit | Attending: Obstetrics | Admitting: Obstetrics

## 2011-11-03 DIAGNOSIS — R519 Headache, unspecified: Secondary | ICD-10-CM

## 2011-11-03 DIAGNOSIS — O26899 Other specified pregnancy related conditions, unspecified trimester: Secondary | ICD-10-CM

## 2011-11-03 DIAGNOSIS — O480 Post-term pregnancy: Principal | ICD-10-CM | POA: Diagnosis present

## 2011-11-03 LAB — CBC
HCT: 33.4 % — ABNORMAL LOW (ref 36.0–46.0)
Hemoglobin: 10.1 g/dL — ABNORMAL LOW (ref 12.0–15.0)
MCH: 20.8 pg — ABNORMAL LOW (ref 26.0–34.0)
MCHC: 30.2 g/dL (ref 30.0–36.0)
MCV: 68.9 fL — ABNORMAL LOW (ref 78.0–100.0)
Platelets: 349 10*3/uL (ref 150–400)
RBC: 4.85 MIL/uL (ref 3.87–5.11)
RDW: 18.3 % — ABNORMAL HIGH (ref 11.5–15.5)
WBC: 12 10*3/uL — ABNORMAL HIGH (ref 4.0–10.5)

## 2011-11-03 LAB — RPR: RPR Ser Ql: NONREACTIVE

## 2011-11-03 MED ORDER — IBUPROFEN 600 MG PO TABS
600.0000 mg | ORAL_TABLET | Freq: Four times a day (QID) | ORAL | Status: DC
  Administered 2011-11-03 – 2011-11-05 (×6): 600 mg via ORAL
  Filled 2011-11-03 (×6): qty 1

## 2011-11-03 MED ORDER — OXYTOCIN 10 UNIT/ML IJ SOLN
40.0000 [IU] | INTRAVENOUS | Status: DC
  Administered 2011-11-03: 40 [IU] via INTRAVENOUS
  Filled 2011-11-03: qty 4

## 2011-11-03 MED ORDER — ZOLPIDEM TARTRATE 5 MG PO TABS: 5.0000 mg | ORAL_TABLET | Freq: Every evening | ORAL | Status: DC | PRN

## 2011-11-03 MED ORDER — METHYLERGONOVINE MALEATE 0.2 MG PO TABS: 0.2000 mg | ORAL_TABLET | ORAL | Status: DC | PRN

## 2011-11-03 MED ORDER — TETANUS-DIPHTH-ACELL PERTUSSIS 5-2.5-18.5 LF-MCG/0.5 IM SUSP
0.5000 mL | Freq: Once | INTRAMUSCULAR | Status: AC
  Administered 2011-11-04: 0.5 mL via INTRAMUSCULAR
  Filled 2011-11-03: qty 0.5

## 2011-11-03 MED ORDER — NALBUPHINE SYRINGE 5 MG/0.5 ML
10.0000 mg | INJECTION | INTRAMUSCULAR | Status: DC | PRN
  Administered 2011-11-03: 10 mg via INTRAVENOUS
  Filled 2011-11-03: qty 1

## 2011-11-03 MED ORDER — OXYTOCIN 20 UNITS IN LACTATED RINGERS INFUSION - SIMPLE: 125.0000 mL/h | INTRAVENOUS | Status: DC | PRN

## 2011-11-03 MED ORDER — ONDANSETRON HCL 4 MG PO TABS: 4.0000 mg | ORAL_TABLET | ORAL | Status: DC | PRN

## 2011-11-03 MED ORDER — LANOLIN HYDROUS EX OINT: TOPICAL_OINTMENT | CUTANEOUS | Status: DC | PRN

## 2011-11-03 MED ORDER — METHYLERGONOVINE MALEATE 0.2 MG/ML IJ SOLN: 0.2000 mg | INTRAMUSCULAR | Status: DC | PRN

## 2011-11-03 MED ORDER — OXYTOCIN BOLUS FROM INFUSION
500.0000 mL | Freq: Once | INTRAVENOUS | Status: AC
  Administered 2011-11-03: 500 mL via INTRAVENOUS
  Filled 2011-11-03: qty 500

## 2011-11-03 MED ORDER — SIMETHICONE 80 MG PO CHEW: 80.0000 mg | CHEWABLE_TABLET | ORAL | Status: DC | PRN

## 2011-11-03 MED ORDER — SENNOSIDES-DOCUSATE SODIUM 8.6-50 MG PO TABS
2.0000 | ORAL_TABLET | Freq: Every day | ORAL | Status: DC
  Administered 2011-11-03 – 2011-11-04 (×2): 2 via ORAL

## 2011-11-03 MED ORDER — PRENATAL MULTIVITAMIN CH
1.0000 | ORAL_TABLET | Freq: Every day | ORAL | Status: DC
  Administered 2011-11-04 – 2011-11-05 (×2): 1 via ORAL
  Filled 2011-11-03 (×2): qty 1

## 2011-11-03 MED ORDER — TERBUTALINE SULFATE 1 MG/ML IJ SOLN: 0.2500 mg | Freq: Once | INTRAMUSCULAR | Status: DC | PRN

## 2011-11-03 MED ORDER — DIPHENHYDRAMINE HCL 25 MG PO CAPS: 25.0000 mg | ORAL_CAPSULE | Freq: Four times a day (QID) | ORAL | Status: DC | PRN

## 2011-11-03 MED ORDER — DIBUCAINE 1 % RE OINT: 1.0000 "application " | TOPICAL_OINTMENT | RECTAL | Status: DC | PRN

## 2011-11-03 MED ORDER — HYDROXYZINE HCL 50 MG PO TABS: 50.0000 mg | ORAL_TABLET | Freq: Four times a day (QID) | ORAL | Status: DC | PRN

## 2011-11-03 MED ORDER — BENZOCAINE-MENTHOL 20-0.5 % EX AERO
1.0000 "application " | INHALATION_SPRAY | CUTANEOUS | Status: DC | PRN
  Administered 2011-11-04: 1 via TOPICAL

## 2011-11-03 MED ORDER — OXYTOCIN 20 UNITS IN LACTATED RINGERS INFUSION - SIMPLE
1.0000 m[IU]/min | INTRAVENOUS | Status: DC
  Administered 2011-11-03: 2 m[IU]/min via INTRAVENOUS
  Filled 2011-11-03: qty 1000

## 2011-11-03 MED ORDER — OXYTOCIN 20 UNITS IN LACTATED RINGERS INFUSION - SIMPLE: 125.0000 mL/h | Freq: Once | INTRAVENOUS | Status: DC

## 2011-11-03 MED ORDER — LACTATED RINGERS IV SOLN
INTRAVENOUS | Status: DC
  Administered 2011-11-03 (×2): via INTRAVENOUS

## 2011-11-03 MED ORDER — MEDROXYPROGESTERONE ACETATE 150 MG/ML IM SUSP: 150.0000 mg | INTRAMUSCULAR | Status: DC | PRN

## 2011-11-03 MED ORDER — ACETAMINOPHEN 325 MG PO TABS: 650.0000 mg | ORAL_TABLET | ORAL | Status: DC | PRN

## 2011-11-03 MED ORDER — ONDANSETRON HCL 4 MG/2ML IJ SOLN: 4.0000 mg | Freq: Four times a day (QID) | INTRAMUSCULAR | Status: DC | PRN

## 2011-11-03 MED ORDER — WITCH HAZEL-GLYCERIN EX PADS: 1.0000 "application " | MEDICATED_PAD | CUTANEOUS | Status: DC | PRN

## 2011-11-03 MED ORDER — LACTATED RINGERS IV SOLN: 500.0000 mL | INTRAVENOUS | Status: DC | PRN

## 2011-11-03 MED ORDER — IBUPROFEN 600 MG PO TABS: 600.0000 mg | ORAL_TABLET | Freq: Four times a day (QID) | ORAL | Status: DC | PRN

## 2011-11-03 MED ORDER — HYDROXYZINE HCL 50 MG/ML IM SOLN
50.0000 mg | Freq: Four times a day (QID) | INTRAMUSCULAR | Status: DC | PRN
  Administered 2011-11-03: 50 mg via INTRAMUSCULAR
  Filled 2011-11-03: qty 1

## 2011-11-03 MED ORDER — CITRIC ACID-SODIUM CITRATE 334-500 MG/5ML PO SOLN: 30.0000 mL | ORAL | Status: DC | PRN

## 2011-11-03 MED ORDER — LIDOCAINE HCL (PF) 1 % IJ SOLN
30.0000 mL | INTRAMUSCULAR | Status: DC | PRN
  Administered 2011-11-03: 30 mL via SUBCUTANEOUS
  Filled 2011-11-03: qty 30

## 2011-11-03 MED ORDER — OXYCODONE-ACETAMINOPHEN 5-325 MG PO TABS: 1.0000 | ORAL_TABLET | ORAL | Status: DC | PRN

## 2011-11-03 MED ORDER — ONDANSETRON HCL 4 MG/2ML IJ SOLN: 4.0000 mg | INTRAMUSCULAR | Status: DC | PRN

## 2011-11-03 MED ORDER — HYDROXYZINE HCL 50 MG/ML IM SOLN
50.0000 mg | Freq: Four times a day (QID) | INTRAMUSCULAR | Status: DC | PRN
Start: 2011-11-03 — End: 2011-11-03

## 2011-11-03 MED ORDER — NALBUPHINE SYRINGE 5 MG/0.5 ML
10.0000 mg | INJECTION | Freq: Four times a day (QID) | INTRAMUSCULAR | Status: DC | PRN
  Administered 2011-11-03: 10 mg via INTRAMUSCULAR
  Filled 2011-11-03: qty 1

## 2011-11-03 NOTE — Progress Notes (Signed)
TEKELIA KAREEM is a 21 y.o. [id number] at [redacted]w[redacted]d by LMP admitted for induction of labor due to Post dates. Due date 10-25-11.  Subjective:   Objective: BP 119/68  Pulse 99  Temp(Src) 98.3 F (36.8 C) (Oral)  Resp 20  Ht 5\' 3"  (1.6 m)  Wt 180 lb (81.647 kg)  BMI 31.89 kg/m2      FHT:  FHR: 150 bpm, variability: moderate,  accelerations:  Present,  decelerations:  Absent UC:   regular, every 3 minutes SVE:   Dilation: 9 Effacement (%): 100 Station: +1 Exam by:: Isac Sarna, RN  Labs: Lab Results  Component Value Date   WBC 12.0* 11/03/2011   HGB 10.1* 11/03/2011   HCT 33.4* 11/03/2011   MCV 68.9* 11/03/2011   PLT 349 11/03/2011    Assessment / Plan: Induction of labor due to postdates,  progressing well on pitocin  Labor: Progressing on Pitocin, will continue to increase then AROM Preeclampsia:  n/a Fetal Wellbeing:  Category I Pain Control:  Nubain/Vistaril I/D:  n/a Anticipated MOD:  NSVD  Hilda Wexler A 11/03/2011, 6:07 PM

## 2011-11-03 NOTE — Progress Notes (Signed)
Victoria Humphrey is Humphrey 21 y.o. [id number] at [redacted]w[redacted]d by LMP admitted for induction of labor due to Post dates. Due date 10-25-11.  Subjective:   Objective: BP 104/57  Pulse 90  Temp(Src) 98.6 F (37 C) (Oral)  Resp 20  Ht 5\' 3"  (1.6 m)  Wt 180 lb (81.647 kg)  BMI 31.89 kg/m2      FHT:  FHR: 150 bpm, variability: moderate,  accelerations:  Present,  decelerations:  Absent UC:   irregular, every 7-10 minutes SVE:   Dilation: 4 Effacement (%): 70 Station: -2 Exam by:: Victoria Sarna, RN  Labs: Lab Results  Component Value Date   WBC 12.0* 05/11/2011   HGB 11.9* 05/11/2011   HCT 34.5* 05/11/2011   MCV 82.7 05/11/2011   PLT 273 05/11/2011    Assessment / Plan: Induction of labor due to postdates,  progressing well on pitocin  Labor: Progressing normally Preeclampsia:  n/Humphrey Fetal Wellbeing:  Category I Pain Control:  Labor support without medications I/D:  n/Humphrey Anticipated MOD:  NSVD  Victoria Humphrey 11/03/2011, 9:33 AM

## 2011-11-03 NOTE — Progress Notes (Signed)
ATLAS KUC is a 21 y.o. [id number] at [redacted]w[redacted]d by LMP admitted for induction of labor due to Post dates. Due date 10-25-11.  Subjective:   Objective: BP 131/83  Pulse 88  Temp(Src) 98.1 F (36.7 C) (Oral)  Resp 20  Ht 5\' 3"  (1.6 m)  Wt 180 lb (81.647 kg)  BMI 31.89 kg/m2      FHT:  FHR: 150 bpm, variability: moderate,  accelerations:  Present,  decelerations:  Absent UC:   regular, every 3 minutes SVE:   Dilation: 6 Effacement (%): 100 Station: -1 Exam by:: Isac Sarna, RN  Labs: Lab Results  Component Value Date   WBC 12.0* 11/03/2011   HGB 10.1* 11/03/2011   HCT 33.4* 11/03/2011   MCV 68.9* 11/03/2011   PLT 349 11/03/2011    Assessment / Plan: Induction of labor due to postdates,  progressing well on pitocin  Labor: Progressing on Pitocin, will continue to increase then AROM Preeclampsia:  n/a Fetal Wellbeing:  Category I Pain Control:  Labor support without medications I/D:  n/a Anticipated MOD:  NSVD  Ellia Knowlton A 11/03/2011, 3:44 PM

## 2011-11-03 NOTE — H&P (Signed)
Victoria Humphrey is a 21 y.o. female presenting for IOL for postdates. Maternal Medical History:  Fetal activity: Perceived fetal activity is normal.   Last perceived fetal movement was within the past hour.    Prenatal complications: no prenatal complications Prenatal Complications - Diabetes: none.    OB History    Grav Para Term Preterm Abortions TAB SAB Ect Mult Living   3 1 0 1 1  1   1      Past Medical History  Diagnosis Date  . Migraine headache    Past Surgical History  Procedure Date  . Dilation and curettage of uterus    Family History: family history includes Birth defects in her brother; Cancer in her paternal grandmother; and Diabetes in her father, paternal aunt, paternal grandmother, and paternal uncle.  There is no history of Anesthesia problems. Social History:  reports that she has never smoked. She has never used smokeless tobacco. She reports that she does not drink alcohol or use illicit drugs.  Review of Systems  All other systems reviewed and are negative.      Blood pressure 112/63, pulse 98, temperature 98.6 F (37 C), temperature source Oral, resp. rate 20, height 5\' 3"  (1.6 m), weight 180 lb (81.647 kg). Maternal Exam:  Uterine Assessment: Contraction strength is mild.  Contraction frequency is irregular.   Abdomen: Patient reports no abdominal tenderness. Fetal presentation: vertex  Introitus: Normal vulva. Normal vagina.  Ferning test: not done.  Nitrazine test: not done. Amniotic fluid character: not assessed.  Pelvis: adequate for delivery.   Cervix: Cervix evaluated by digital exam.     Physical Exam  Nursing note and vitals reviewed. Constitutional: She is oriented to person, place, and time. She appears well-developed and well-nourished.  HENT:  Head: Normocephalic and atraumatic.  Cardiovascular: Normal rate and regular rhythm.   Respiratory: Effort normal.  GI: Soft.  Genitourinary: Vagina normal and uterus normal.    Musculoskeletal: Normal range of motion.  Neurological: She is alert and oriented to person, place, and time.  Skin: Skin is warm and dry.    Prenatal labs: ABO, Rh: --/--/O NEG (11/13 1830) Antibody: NEG (11/13 1830) Rubella: Immune (08/17 0000) RPR: Nonreactive (08/17 0000)  HBsAg: Negative (08/17 0000)  HIV: Non-reactive (08/17 0000)  GBS: Negative (12/31 0000)   Assessment/Plan:Postdates.  IOL.   Nichael Ehly A 11/03/2011, 9:18 AM

## 2011-11-04 LAB — CBC
HCT: 28.2 % — ABNORMAL LOW (ref 36.0–46.0)
Hemoglobin: 8.6 g/dL — ABNORMAL LOW (ref 12.0–15.0)
MCH: 21.2 pg — ABNORMAL LOW (ref 26.0–34.0)
MCHC: 30.5 g/dL (ref 30.0–36.0)
MCV: 69.6 fL — ABNORMAL LOW (ref 78.0–100.0)
Platelets: 281 10*3/uL (ref 150–400)
RBC: 4.05 MIL/uL (ref 3.87–5.11)
RDW: 18.4 % — ABNORMAL HIGH (ref 11.5–15.5)
WBC: 15.1 10*3/uL — ABNORMAL HIGH (ref 4.0–10.5)

## 2011-11-04 MED ORDER — BENZOCAINE-MENTHOL 20-0.5 % EX AERO
INHALATION_SPRAY | CUTANEOUS | Status: AC
  Administered 2011-11-04: 21:00:00
  Filled 2011-11-04: qty 56

## 2011-11-04 MED ORDER — RHO D IMMUNE GLOBULIN 1500 UNIT/2ML IJ SOLN
300.0000 ug | Freq: Once | INTRAMUSCULAR | Status: AC
  Administered 2011-11-04: 300 ug via INTRAMUSCULAR
  Filled 2011-11-04: qty 2

## 2011-11-04 NOTE — Progress Notes (Signed)
Post Partum Day 1 Subjective: no complaints  Objective: Blood pressure 100/63, pulse 80, temperature 97.9 F (36.6 C), temperature source Oral, resp. rate 18, height 5\' 3"  (1.6 m), weight 180 lb (81.647 kg), SpO2 97.00%, unknown if currently breastfeeding.  Physical Exam:  General: alert and no distress Lochia: appropriate Uterine Fundus: firm Incision: healing well DVT Evaluation: No evidence of DVT seen on physical exam.   Basename 11/04/11 0555 11/03/11 0915  HGB 8.6* 10.1*  HCT 28.2* 33.4*    Assessment/Plan: Plan for discharge tomorrow   LOS: 1 day   Victoria Humphrey A 11/04/2011, 7:52 AM

## 2011-11-04 NOTE — Progress Notes (Signed)
UR chart review completed.  

## 2011-11-05 LAB — RH IG WORKUP (INCLUDES ABO/RH)
ABO/RH(D): O NEG
Fetal Screen: NEGATIVE
Gestational Age(Wks): 41
Unit division: 0

## 2011-11-05 MED ORDER — IBUPROFEN 600 MG PO TABS: 600.0000 mg | ORAL_TABLET | Freq: Four times a day (QID) | ORAL | Status: DC

## 2011-11-05 NOTE — Progress Notes (Signed)
Post Partum Day 2 Subjective: no complaints  Objective: Blood pressure 113/74, pulse 91, temperature 98.1 F (36.7 C), temperature source Oral, resp. rate 16, height 5\' 3"  (1.6 m), weight 180 lb (81.647 kg), SpO2 97.00%, unknown if currently breastfeeding.  Physical Exam:  General: alert and no distress Lochia: appropriate Uterine Fundus: firm Incision: healing well DVT Evaluation: No evidence of DVT seen on physical exam.   Basename 11/04/11 0555 11/03/11 0915  HGB 8.6* 10.1*  HCT 28.2* 33.4*    Assessment/Plan: Discharge home   LOS: 2 days   Victoria Humphrey A 11/05/2011, 8:15 AM

## 2011-11-05 NOTE — Discharge Summary (Signed)
Obstetric Discharge Summary Reason for Admission: induction of labor Prenatal Procedures: NST and ultrasound Intrapartum Procedures: spontaneous vaginal delivery Postpartum Procedures: none Complications-Operative and Postpartum: none Hemoglobin  Date Value Range Status  11/04/2011 8.6* 12.0-15.0 (g/dL) Final     HCT  Date Value Range Status  11/04/2011 28.2* 36.0-46.0 (%) Final    Discharge Diagnoses: Term Pregnancy-delivered  Discharge Information: Date: 11/05/2011 Activity: pelvic rest Diet: routine Medications: PNV, Ibuprofen and Colace Condition: stable Instructions: refer to practice specific booklet Discharge to: home Follow-up Information    Schedule an appointment as soon as possible for a visit with [name], MD.   Contact information:   [address] [place] [address] [phone]          Newborn Data: Live born female  Birth Weight: 9 lb 4 oz (4196 g) APGAR: 8, 9  Home with mother.  Adalberto Metzgar A 11/05/2011, 8:27 AM

## 2013-03-21 DIAGNOSIS — Z8619 Personal history of other infectious and parasitic diseases: Secondary | ICD-10-CM

## 2013-03-21 HISTORY — DX: Personal history of other infectious and parasitic diseases: Z86.19

## 2013-06-22 ENCOUNTER — Encounter: Payer: Self-pay | Admitting: Obstetrics

## 2013-06-23 ENCOUNTER — Encounter: Payer: Self-pay | Admitting: Advanced Practice Midwife

## 2013-07-10 ENCOUNTER — Encounter: Payer: Self-pay | Admitting: Obstetrics

## 2013-07-10 ENCOUNTER — Ambulatory Visit (INDEPENDENT_AMBULATORY_CARE_PROVIDER_SITE_OTHER): Payer: Medicaid Other | Admitting: Obstetrics

## 2013-07-10 ENCOUNTER — Encounter: Payer: Self-pay | Admitting: Advanced Practice Midwife

## 2013-07-10 VITALS — BP 113/77 | Temp 97.2°F | Wt 128.0 lb

## 2013-07-10 DIAGNOSIS — O0990 Supervision of high risk pregnancy, unspecified, unspecified trimester: Secondary | ICD-10-CM | POA: Insufficient documentation

## 2013-07-10 DIAGNOSIS — O219 Vomiting of pregnancy, unspecified: Secondary | ICD-10-CM

## 2013-07-10 DIAGNOSIS — O210 Mild hyperemesis gravidarum: Secondary | ICD-10-CM

## 2013-07-10 DIAGNOSIS — Z8751 Personal history of pre-term labor: Secondary | ICD-10-CM | POA: Insufficient documentation

## 2013-07-10 DIAGNOSIS — Z3201 Encounter for pregnancy test, result positive: Secondary | ICD-10-CM

## 2013-07-10 DIAGNOSIS — Z113 Encounter for screening for infections with a predominantly sexual mode of transmission: Secondary | ICD-10-CM

## 2013-07-10 DIAGNOSIS — Z3481 Encounter for supervision of other normal pregnancy, first trimester: Secondary | ICD-10-CM

## 2013-07-10 DIAGNOSIS — O09219 Supervision of pregnancy with history of pre-term labor, unspecified trimester: Secondary | ICD-10-CM

## 2013-07-10 DIAGNOSIS — O09891 Supervision of other high risk pregnancies, first trimester: Secondary | ICD-10-CM

## 2013-07-10 DIAGNOSIS — O09899 Supervision of other high risk pregnancies, unspecified trimester: Secondary | ICD-10-CM | POA: Insufficient documentation

## 2013-07-10 HISTORY — DX: Personal history of pre-term labor: Z87.51

## 2013-07-10 LAB — POCT URINALYSIS DIPSTICK
Bilirubin, UA: NEGATIVE
Blood, UA: NEGATIVE
Glucose, UA: NEGATIVE
Ketones, UA: NEGATIVE
Leukocytes, UA: NEGATIVE
Nitrite, UA: NEGATIVE
Protein, UA: NEGATIVE
Spec Grav, UA: 1.015
Urobilinogen, UA: NEGATIVE
pH, UA: 6

## 2013-07-10 MED ORDER — DOXYLAMINE-PYRIDOXINE 10-10 MG PO TBEC: 10.0000 mg | DELAYED_RELEASE_TABLET | ORAL | Status: DC

## 2013-07-10 NOTE — Progress Notes (Signed)
Pulse-72 Pt states she is having pain in her right side. Pt states she went to [facility] for the pain. Pt states she was informed that she had a cyst.   Subjective:    Victoria Humphrey is being seen today for her first obstetrical visit.  This is not a planned pregnancy. She is at [redacted]w[redacted]d gestation. Her obstetrical history is significant for previous preterm labor and previous preterm delivery.. Relationship with FOB: significant other, not living together. Patient unsure intend to breast feed. Pregnancy history fully reviewed.  Menstrual History: OB History   Grav Para Term Preterm Abortions TAB SAB Ect Mult Living   4 2 1 1 1  1   2       Last Pap: unsure Menarche age: 74  Patient's last menstrual period was 05/17/2013.    The following portions of the patient's history were reviewed and updated as appropriate: allergies, current medications, past family history, past medical history, past social history, past surgical history and problem list.  Review of Systems Pertinent items are noted in HPI.    Objective:    General appearance: alert and no distress Abdomen: normal findings: soft, non-tender Pelvic: cervix normal in appearance, external genitalia normal, no adnexal masses or tenderness, no cervical motion tenderness, uterus normal size, shape, and consistency and vagina normal without discharge    Assessment:    Pregnancy at [redacted]w[redacted]d weeks   H/O PTL and delivery at ~ 36 weeks with 1st pregnancy, then postdates delivery after having PTL.   Plan:    Initial labs drawn. Prenatal vitamins.  Counseling provided regarding continued use of seat belts, cessation of alcohol consumption, smoking or use of illicit drugs; infection precautions i.e., influenza/TDAP immunizations, toxoplasmosis,CMV, parvovirus, listeria and varicella; workplace safety, exercise during pregnancy; routine dental care, safe medications, sexual activity, hot tubs, saunas, pools, travel, caffeine use, fish and  methlymercury, potential toxins, hair treatments, varicose veins Weight gain recommendations reviewed: underweight/BMI< 18.5--> gain 28 - 40 lbs; normal weight/BMI 18.5 - 24.9--> gain 25 - 35 lbs; overweight/BMI 25 - 29.9--> gain 15 - 25 lbs; obese/BMI >30->gain  11 - 20 lbs Problem list reviewed and updated. AFP3 discussed: requested. Role of ultrasound in pregnancy discussed; fetal survey: requested. Amniocentesis discussed: not indicated. Follow up in 3 weeks. 50% of 20 min visit spent on counseling and coordination of care.  Referred to MFM for poor OB Hx.

## 2013-07-11 LAB — HIV ANTIBODY (ROUTINE TESTING W REFLEX): HIV: NONREACTIVE

## 2013-07-11 LAB — OBSTETRIC PANEL
Antibody Screen: NEGATIVE
Basophils Absolute: 0 10*3/uL (ref 0.0–0.1)
Basophils Relative: 0 % (ref 0–1)
Eosinophils Absolute: 0 10*3/uL (ref 0.0–0.7)
Eosinophils Relative: 0 % (ref 0–5)
HCT: 43.2 % (ref 36.0–46.0)
Hemoglobin: 15.2 g/dL — ABNORMAL HIGH (ref 12.0–15.0)
Hepatitis B Surface Ag: NEGATIVE
Lymphocytes Relative: 16 % (ref 12–46)
Lymphs Abs: 1.9 10*3/uL (ref 0.7–4.0)
MCH: 31.7 pg (ref 26.0–34.0)
MCHC: 35.2 g/dL (ref 30.0–36.0)
MCV: 90 fL (ref 78.0–100.0)
Monocytes Absolute: 0.9 10*3/uL (ref 0.1–1.0)
Monocytes Relative: 8 % (ref 3–12)
Neutro Abs: 8.7 10*3/uL — ABNORMAL HIGH (ref 1.7–7.7)
Neutrophils Relative %: 76 % (ref 43–77)
Platelets: 291 10*3/uL (ref 150–400)
RBC: 4.8 MIL/uL (ref 3.87–5.11)
RDW: 14.1 % (ref 11.5–15.5)
Rh Type: NEGATIVE
Rubella: 2.57 {index} — ABNORMAL HIGH
WBC: 11.5 10*3/uL — ABNORMAL HIGH (ref 4.0–10.5)

## 2013-07-11 LAB — PAP IG W/ RFLX HPV ASCU

## 2013-07-11 LAB — VARICELLA ZOSTER ANTIBODY, IGG: Varicella IgG: 167.3 {index} — ABNORMAL HIGH

## 2013-07-11 LAB — CULTURE, OB URINE
Colony Count: NO GROWTH
Organism ID, Bacteria: NO GROWTH

## 2013-07-11 LAB — GC/CHLAMYDIA PROBE AMP
CT Probe RNA: NEGATIVE
GC Probe RNA: NEGATIVE

## 2013-07-11 LAB — WET PREP BY MOLECULAR PROBE
Candida species: NEGATIVE
Gardnerella vaginalis: NEGATIVE
Trichomonas vaginosis: NEGATIVE

## 2013-07-11 LAB — VITAMIN D 25 HYDROXY (VIT D DEFICIENCY, FRACTURES): Vit D, 25-Hydroxy: 27 ng/mL — ABNORMAL LOW (ref 30–89)

## 2013-07-13 ENCOUNTER — Encounter: Payer: Medicaid Other | Admitting: Obstetrics

## 2013-07-17 ENCOUNTER — Ambulatory Visit (HOSPITAL_COMMUNITY): Admission: RE | Admit: 2013-07-17 | Payer: Medicaid Other | Source: Ambulatory Visit

## 2013-08-01 ENCOUNTER — Other Ambulatory Visit: Payer: Self-pay | Admitting: Obstetrics

## 2013-08-01 ENCOUNTER — Ambulatory Visit (INDEPENDENT_AMBULATORY_CARE_PROVIDER_SITE_OTHER): Payer: Medicaid Other | Admitting: Obstetrics

## 2013-08-01 ENCOUNTER — Ambulatory Visit (HOSPITAL_COMMUNITY)
Admission: RE | Admit: 2013-08-01 | Discharge: 2013-08-01 | Disposition: A | Discharge status: Home / Self Care | Payer: Medicaid Other | Source: Ambulatory Visit | Attending: Obstetrics | Admitting: Obstetrics

## 2013-08-01 VITALS — BP 113/76 | Temp 98.3°F | Wt 132.0 lb

## 2013-08-01 DIAGNOSIS — R52 Pain, unspecified: Secondary | ICD-10-CM

## 2013-08-01 DIAGNOSIS — Z8751 Personal history of pre-term labor: Secondary | ICD-10-CM

## 2013-08-01 DIAGNOSIS — O99891 Other specified diseases and conditions complicating pregnancy: Secondary | ICD-10-CM | POA: Insufficient documentation

## 2013-08-01 DIAGNOSIS — Z3480 Encounter for supervision of other normal pregnancy, unspecified trimester: Secondary | ICD-10-CM

## 2013-08-01 DIAGNOSIS — N760 Acute vaginitis: Secondary | ICD-10-CM

## 2013-08-01 DIAGNOSIS — O0990 Supervision of high risk pregnancy, unspecified, unspecified trimester: Secondary | ICD-10-CM

## 2013-08-01 DIAGNOSIS — O09891 Supervision of other high risk pregnancies, first trimester: Secondary | ICD-10-CM

## 2013-08-01 DIAGNOSIS — O26849 Uterine size-date discrepancy, unspecified trimester: Secondary | ICD-10-CM | POA: Insufficient documentation

## 2013-08-01 DIAGNOSIS — Z3481 Encounter for supervision of other normal pregnancy, first trimester: Secondary | ICD-10-CM

## 2013-08-01 DIAGNOSIS — R51 Headache: Secondary | ICD-10-CM

## 2013-08-01 DIAGNOSIS — N949 Unspecified condition associated with female genital organs and menstrual cycle: Secondary | ICD-10-CM | POA: Insufficient documentation

## 2013-08-01 DIAGNOSIS — R519 Headache, unspecified: Secondary | ICD-10-CM

## 2013-08-01 LAB — POCT URINALYSIS DIPSTICK
Bilirubin, UA: NEGATIVE
Blood, UA: NEGATIVE
Glucose, UA: NEGATIVE
Ketones, UA: NEGATIVE
Nitrite, UA: NEGATIVE
Spec Grav, UA: 1.02
Urobilinogen, UA: NEGATIVE
pH, UA: 6

## 2013-08-01 MED ORDER — OXYCODONE HCL 10 MG PO TABS: 10.0000 mg | ORAL_TABLET | Freq: Four times a day (QID) | ORAL | Status: DC | PRN

## 2013-08-01 NOTE — Progress Notes (Signed)
HR - 68 PT in office for routine OB visit, having sharp lower abd pain rated at 8/10 intensifies with movement, reports having same symptoms as she did when she had chlamydia, also reports having bad headaches for past 3 weeks, has noted a knot in her right lateral breast under her arm

## 2013-08-02 ENCOUNTER — Other Ambulatory Visit: Payer: Self-pay | Admitting: *Deleted

## 2013-08-02 ENCOUNTER — Encounter: Payer: Self-pay | Admitting: Obstetrics

## 2013-08-02 DIAGNOSIS — R519 Headache, unspecified: Secondary | ICD-10-CM | POA: Insufficient documentation

## 2013-08-02 DIAGNOSIS — R52 Pain, unspecified: Secondary | ICD-10-CM | POA: Insufficient documentation

## 2013-08-02 DIAGNOSIS — B373 Candidiasis of vulva and vagina: Secondary | ICD-10-CM

## 2013-08-02 HISTORY — DX: Pain, unspecified: R52

## 2013-08-02 LAB — WET PREP BY MOLECULAR PROBE
Candida species: POSITIVE — AB
Gardnerella vaginalis: NEGATIVE
Trichomonas vaginosis: NEGATIVE

## 2013-08-02 LAB — GC/CHLAMYDIA PROBE AMP
CT Probe RNA: NEGATIVE
GC Probe RNA: NEGATIVE

## 2013-08-02 MED ORDER — FLUCONAZOLE 150 MG PO TABS: 150.0000 mg | ORAL_TABLET | Freq: Once | ORAL | Status: DC

## 2013-08-30 ENCOUNTER — Encounter: Payer: Self-pay | Admitting: Obstetrics

## 2013-09-07 ENCOUNTER — Encounter: Payer: Self-pay | Admitting: Obstetrics

## 2013-09-07 ENCOUNTER — Ambulatory Visit (INDEPENDENT_AMBULATORY_CARE_PROVIDER_SITE_OTHER): Payer: Medicaid Other | Admitting: Obstetrics

## 2013-09-07 VITALS — BP 114/73 | Temp 98.0°F | Wt 135.0 lb

## 2013-09-07 DIAGNOSIS — Z3482 Encounter for supervision of other normal pregnancy, second trimester: Secondary | ICD-10-CM

## 2013-09-07 DIAGNOSIS — Z3481 Encounter for supervision of other normal pregnancy, first trimester: Secondary | ICD-10-CM

## 2013-09-07 DIAGNOSIS — R52 Pain, unspecified: Secondary | ICD-10-CM

## 2013-09-07 DIAGNOSIS — O360990 Maternal care for other rhesus isoimmunization, unspecified trimester, not applicable or unspecified: Secondary | ICD-10-CM

## 2013-09-07 DIAGNOSIS — Z3480 Encounter for supervision of other normal pregnancy, unspecified trimester: Secondary | ICD-10-CM

## 2013-09-07 DIAGNOSIS — Z1389 Encounter for screening for other disorder: Secondary | ICD-10-CM

## 2013-09-07 MED ORDER — OXYCODONE HCL 10 MG PO TABS: 10.0000 mg | ORAL_TABLET | Freq: Four times a day (QID) | ORAL | Status: DC | PRN

## 2013-09-07 MED ORDER — RHO D IMMUNE GLOBULIN 1500 UNIT/2ML IJ SOLN
300.0000 ug | Freq: Once | INTRAMUSCULAR | Status: AC
  Administered 2013-09-07: 300 ug via INTRAMUSCULAR

## 2013-09-07 NOTE — Progress Notes (Signed)
HR - 78 Pt in office today for routine OB visit, reports had vaginal bleeding 12/5, denies any bleeding since

## 2013-09-08 LAB — AFP, QUAD SCREEN
AFP: 41.3 IU/mL
Age Alone: 1:1130 {titer}
Curr Gest Age: 16.1 wks.days
Down Syndrome Scr Risk Est: 1:21800 {titer}
HCG, Total: 29399 m[IU]/mL
INH: 161.5 pg/mL
Interpretation-AFP: NEGATIVE
MoM for AFP: 1.26
MoM for INH: 0.79
MoM for hCG: 1.16
Open Spina bifida: NEGATIVE
Osb Risk: 1:5610 {titer}
Tri 18 Scr Risk Est: NEGATIVE
Trisomy 18 (Edward) Syndrome Interp.: 1:95400 {titer}
uE3 Mom: 0.89
uE3 Value: 0.5 ng/mL

## 2013-09-21 NOTE — L&D Delivery Note (Signed)
Delivery Note At 7:01 PM a viable female was delivered via Vaginal, Spontaneous Delivery (Presentation: ; Left Occiput Anterior).  APGAR: 7, 9 .   Placenta status: Intact, Spontaneous via Tomasa Blase.  Cord: 3 vessels with the following complications: None.   Anesthesia: Local  Episiotomy: None Lacerations: 2nd degree Suture Repair: 2.0 3.0 vicryl rapide Est. Blood Loss (mL): 300 ml  Mom to postpartum.  Baby to Couplet care / Skin to Skin.  [name] 02/14/2014, 7:28 PM

## 2013-09-26 ENCOUNTER — Institutional Professional Consult (permissible substitution): Payer: Medicaid Other | Admitting: Neurology

## 2013-09-26 ENCOUNTER — Other Ambulatory Visit: Payer: Medicaid Other

## 2013-09-26 ENCOUNTER — Telehealth: Payer: Self-pay | Admitting: *Deleted

## 2013-10-03 ENCOUNTER — Encounter: Payer: Self-pay | Admitting: Obstetrics

## 2013-10-03 ENCOUNTER — Ambulatory Visit (INDEPENDENT_AMBULATORY_CARE_PROVIDER_SITE_OTHER): Payer: Medicaid Other

## 2013-10-03 ENCOUNTER — Ambulatory Visit (INDEPENDENT_AMBULATORY_CARE_PROVIDER_SITE_OTHER): Payer: Medicaid Other | Admitting: Obstetrics

## 2013-10-03 VITALS — BP 108/72 | Temp 97.8°F | Wt 139.0 lb

## 2013-10-03 DIAGNOSIS — Z3480 Encounter for supervision of other normal pregnancy, unspecified trimester: Secondary | ICD-10-CM

## 2013-10-03 DIAGNOSIS — O365990 Maternal care for other known or suspected poor fetal growth, unspecified trimester, not applicable or unspecified: Secondary | ICD-10-CM

## 2013-10-03 DIAGNOSIS — O3503X0 Maternal care for (suspected) central nervous system malformation or damage in fetus, choroid plexus cysts, not applicable or unspecified: Secondary | ICD-10-CM

## 2013-10-03 DIAGNOSIS — O3500X0 Maternal care for (suspected) central nervous system malformation or damage in fetus, unspecified, not applicable or unspecified: Secondary | ICD-10-CM

## 2013-10-03 DIAGNOSIS — O350XX0 Maternal care for (suspected) central nervous system malformation in fetus, not applicable or unspecified: Secondary | ICD-10-CM

## 2013-10-03 DIAGNOSIS — Z1389 Encounter for screening for other disorder: Secondary | ICD-10-CM

## 2013-10-03 LAB — POCT URINALYSIS DIPSTICK
Bilirubin, UA: NEGATIVE
Blood, UA: NEGATIVE
Ketones, UA: NEGATIVE
Nitrite, UA: NEGATIVE
Protein, UA: NEGATIVE
Spec Grav, UA: <=1.005
Urobilinogen, UA: NEGATIVE
pH, UA: 7

## 2013-10-03 NOTE — Progress Notes (Signed)
HR - 87 Pt in office today for routine OB visit, reports still having mult episode of pain and cramping to abd and back, reports having a lot of heart burn daily

## 2013-10-05 ENCOUNTER — Encounter (HOSPITAL_COMMUNITY): Payer: Self-pay | Admitting: Obstetrics

## 2013-10-05 ENCOUNTER — Encounter: Payer: Self-pay | Admitting: Obstetrics

## 2013-10-05 LAB — US OB DETAIL + 14 WK

## 2013-10-11 ENCOUNTER — Other Ambulatory Visit: Payer: Self-pay | Admitting: Obstetrics

## 2013-10-11 ENCOUNTER — Encounter: Payer: Self-pay | Admitting: Obstetrics & Gynecology

## 2013-10-11 DIAGNOSIS — IMO0002 Reserved for concepts with insufficient information to code with codable children: Secondary | ICD-10-CM

## 2013-10-11 DIAGNOSIS — O350XX0 Maternal care for (suspected) central nervous system malformation in fetus, not applicable or unspecified: Secondary | ICD-10-CM | POA: Insufficient documentation

## 2013-10-11 DIAGNOSIS — Z0489 Encounter for examination and observation for other specified reasons: Secondary | ICD-10-CM

## 2013-10-12 ENCOUNTER — Ambulatory Visit (HOSPITAL_COMMUNITY)
Admission: RE | Admit: 2013-10-12 | Discharge: 2013-10-12 | Disposition: A | Discharge status: Home / Self Care | Payer: Medicaid Other | Source: Ambulatory Visit | Attending: Obstetrics | Admitting: Obstetrics

## 2013-10-12 ENCOUNTER — Encounter (HOSPITAL_COMMUNITY): Payer: Medicaid Other

## 2013-10-17 ENCOUNTER — Encounter (HOSPITAL_COMMUNITY): Payer: Self-pay

## 2013-10-17 ENCOUNTER — Ambulatory Visit (HOSPITAL_COMMUNITY)
Admission: RE | Admit: 2013-10-17 | Discharge: 2013-10-17 | Disposition: A | Discharge status: Home / Self Care | Payer: Medicaid Other | Source: Ambulatory Visit | Attending: Obstetrics | Admitting: Obstetrics

## 2013-10-17 VITALS — BP 112/67 | HR 89 | Wt 144.0 lb

## 2013-10-17 DIAGNOSIS — IMO0002 Reserved for concepts with insufficient information to code with codable children: Secondary | ICD-10-CM

## 2013-10-17 DIAGNOSIS — Z1389 Encounter for screening for other disorder: Secondary | ICD-10-CM | POA: Insufficient documentation

## 2013-10-17 DIAGNOSIS — O09212 Supervision of pregnancy with history of pre-term labor, second trimester: Secondary | ICD-10-CM

## 2013-10-17 DIAGNOSIS — O358XX0 Maternal care for other (suspected) fetal abnormality and damage, not applicable or unspecified: Secondary | ICD-10-CM | POA: Insufficient documentation

## 2013-10-17 DIAGNOSIS — O350XX0 Maternal care for (suspected) central nervous system malformation in fetus, not applicable or unspecified: Secondary | ICD-10-CM

## 2013-10-17 DIAGNOSIS — Z0489 Encounter for examination and observation for other specified reasons: Secondary | ICD-10-CM

## 2013-10-17 DIAGNOSIS — Z363 Encounter for antenatal screening for malformations: Secondary | ICD-10-CM | POA: Insufficient documentation

## 2013-10-17 NOTE — Progress Notes (Signed)
[facility]  Indication: 23 yr old [id number] at 2837w6d for fetal anatomic survey. Finding of choroid plexus cyst on outside ultrasound. History of 36 week delivery.  Findings: 1. Single intrauterine pregnancy. 2. Fetal biometry is consistent with dating. 3. Anterior placenta without evidence of previa. 4. Normal amniotic fluid volume. 5. Normal transabdominal cervical length. 6. The views of the cavum, nose/lips, palate, and heart are limited. 7. The remainder of the limited anatomy survey is normal.  Recommendations: 1. Appropriate fetal growth. 2. Limited anatomy survey: - recommend follow up in 3-4 weeks to complete anatomic survey - some views showed borderline pyelectasis (resolved during exam)- reevaluate kidneys on follow up exam 3. History of 36 week delivery: - normal cervical length today - recommend preterm labor precautions - can offer 17 OH progesterone if desired although patient reports delivered full term (baby was 7lbs 6oz) 4. Previous finding of choroid plexus cyst: - see consult letter I discussed with the patient that no cyst is seen on today's ultrasound and that I do not have access to the pictures from the outside ultrasound. If, however, there were a choroid plexus cyst seen at the time of the anatomy survey I counseled the patient that this can be associated with a slightly higher risk of fetal aneuploidy (in particular trisomy 18). I discussed that with an otherwise normal anatomic survey the risk of the fetus having trisomy 5918 is extremely low. I reiterated that we could not complete a full anatomic survey today but no abnormalities were seen.  The patient had a normal quad screen which showed low risk for trisomies 13, 18, and 21. I discussed the limitations of ultrasound and screening tests in detecting fetal aneuploidy. I discussed benefits/risks of amniocentesis. After counseling patient declined amniocentesis.   I discussed that isolated  choroid plexus cysts detected prenatally are not associated with long term adverse neurodevelopmental outcomes. Patient will follow up to complete anatomy.  Eulis FosterKristen Tytiana Coles, MD

## 2013-10-17 NOTE — Consult Note (Signed)
MFM consult   23 yr old [id number] at 3962w6d referred by Dr. Clearance CootsHarper for fetal anatomic survey and consult. Finding of choroid plexus cyst on outside ultrasound. History of 36 week delivery.  Ultrasound today shows: Single intrauterine pregnancy. Fetal biometry is consistent with dating. Anterior placenta without evidence of previa. Normal amniotic fluid volume. Normal transabdominal cervical length.The views of the cavum, nose/lips, palate, and heart are limited. The remainder of the limited anatomy survey is normal.  I counseled the patient as follows: 1. Appropriate fetal growth. 2. Limited anatomy survey: - recommend follow up in 3-4 weeks to complete anatomic survey - some views showed borderline pyelectasis (resolved during exam)- reevaluate kidneys on follow up exam 3. History of 36 week delivery: - normal cervical length today - recommend preterm labor precautions - can offer 17 OH progesterone if desired although patient reports delivered full term (baby was 7lbs 6oz) 4. Previous finding of choroid plexus cyst: - see consult letter I discussed with the patient that no cyst is seen on today's ultrasound and that I do not have access to the pictures from the outside ultrasound. If, however, there were a choroid plexus cyst seen at the time of the anatomy survey I counseled the patient that this can be associated with a slightly higher risk of fetal aneuploidy (in particular trisomy 18). I discussed that with an otherwise normal anatomic survey the risk of the fetus having trisomy 4518 is extremely low. I reiterated that we could not complete a full anatomic survey today but no abnormalities were seen.  The patient had a normal quad screen which showed low risk for trisomies 13, 18, and 21. I discussed the limitations of ultrasound and screening tests in detecting fetal aneuploidy. I discussed benefits/risks of amniocentesis. After counseling patient declined amniocentesis.   I discussed that  isolated choroid plexus cysts detected prenatally are not associated with long term adverse neurodevelopmental outcomes. Patient will follow up to complete anatomy.  I spent 30 minutes total time with the patient of which >50% was spent in face to face consultation discussing the above.  Eulis FosterKristen Clinton Wahlberg, MD

## 2013-10-18 ENCOUNTER — Encounter: Payer: Medicaid Other | Admitting: Obstetrics

## 2013-10-24 ENCOUNTER — Other Ambulatory Visit: Payer: Self-pay | Admitting: Obstetrics

## 2013-10-24 DIAGNOSIS — O358XX0 Maternal care for other (suspected) fetal abnormality and damage, not applicable or unspecified: Secondary | ICD-10-CM

## 2013-11-07 ENCOUNTER — Ambulatory Visit (HOSPITAL_COMMUNITY): Admission: RE | Admit: 2013-11-07 | Payer: Medicaid Other | Source: Ambulatory Visit

## 2013-11-10 ENCOUNTER — Encounter (HOSPITAL_COMMUNITY): Payer: Self-pay | Admitting: *Deleted

## 2013-11-10 ENCOUNTER — Inpatient Hospital Stay (HOSPITAL_COMMUNITY)
Admission: AD | Admit: 2013-11-10 | Discharge: 2013-11-10 | Disposition: A | Discharge status: Home / Self Care | Payer: Medicaid Other | Source: Ambulatory Visit | Attending: Obstetrics | Admitting: Obstetrics

## 2013-11-10 DIAGNOSIS — O26899 Other specified pregnancy related conditions, unspecified trimester: Secondary | ICD-10-CM

## 2013-11-10 DIAGNOSIS — O9989 Other specified diseases and conditions complicating pregnancy, childbirth and the puerperium: Secondary | ICD-10-CM

## 2013-11-10 DIAGNOSIS — O2650 Maternal hypotension syndrome, unspecified trimester: Secondary | ICD-10-CM | POA: Insufficient documentation

## 2013-11-10 DIAGNOSIS — O99891 Other specified diseases and conditions complicating pregnancy: Secondary | ICD-10-CM | POA: Insufficient documentation

## 2013-11-10 DIAGNOSIS — M549 Dorsalgia, unspecified: Secondary | ICD-10-CM | POA: Insufficient documentation

## 2013-11-10 DIAGNOSIS — O4700 False labor before 37 completed weeks of gestation, unspecified trimester: Secondary | ICD-10-CM | POA: Insufficient documentation

## 2013-11-10 DIAGNOSIS — R55 Syncope and collapse: Secondary | ICD-10-CM

## 2013-11-10 DIAGNOSIS — R109 Unspecified abdominal pain: Secondary | ICD-10-CM | POA: Insufficient documentation

## 2013-11-10 LAB — URINALYSIS, ROUTINE W REFLEX MICROSCOPIC
Bilirubin Urine: NEGATIVE
Glucose, UA: NEGATIVE mg/dL
Hgb urine dipstick: NEGATIVE
Ketones, ur: NEGATIVE mg/dL
Leukocytes, UA: NEGATIVE
Nitrite: NEGATIVE
Protein, ur: NEGATIVE mg/dL
Specific Gravity, Urine: 1.015 (ref 1.005–1.030)
Urobilinogen, UA: 1 mg/dL (ref 0.0–1.0)
pH: 7 (ref 5.0–8.0)

## 2013-11-10 LAB — FETAL FIBRONECTIN: Fetal Fibronectin: NEGATIVE

## 2013-11-10 LAB — WET PREP, GENITAL
Clue Cells Wet Prep HPF POC: NONE SEEN
Trich, Wet Prep: NONE SEEN
Yeast Wet Prep HPF POC: NONE SEEN

## 2013-11-10 MED ORDER — PROMETHAZINE HCL 25 MG PO TABS: 12.5000 mg | ORAL_TABLET | Freq: Four times a day (QID) | ORAL | Status: DC | PRN

## 2013-11-10 MED ORDER — ACETAMINOPHEN 500 MG PO TABS
1000.0000 mg | ORAL_TABLET | ORAL | Status: AC
  Administered 2013-11-10: 1000 mg via ORAL
  Filled 2013-11-10: qty 2

## 2013-11-10 NOTE — MAU Provider Note (Signed)
Chief Complaint:  No chief complaint on file.   None     HPI: Victoria Humphrey is a 23 y.o. [phone] at 60w2dwho presents to maternity admissions reporting back pain x several weeks, abdominal pain x2-3 days described as cramping, and dizziness with episode of near syncope while eating today ~7 hours before arrival in MAU.  Pt reports n/v/d off and on for last 3 days.  She also reports white vaginal discharge with no odor, no itching/burning but she would like to have this evaluated. She was seen at [facility] last night, given IV fluids, and discharged but does not feel like they addressed her concerns.  She has hx of preterm delivery at 36 weeks and is concerned that she could be in labor this early.  She reports she has not vomited today but has only had 1/2 Dr Reino Kent to drink and last at at 2 pm (around the time of the near syncope).  She reports good fetal movement, denies LOF, vaginal bleeding, urinary symptoms, h/a, or fever/chills.     Past Medical History: Past Medical History  Diagnosis Date  . Migraine headache     Past obstetric history: OB History  Gravida Para Term Preterm AB SAB TAB Ectopic Multiple Living  4 2 1 1 1 1    2     # Outcome Date GA Lbr Len/2nd Weight Sex Delivery Anes PTL Lv  4 CUR           3 TRM 11/03/11 [redacted]w[redacted]d 04:35 / 00:18 4.196 kg (9 lb 4 oz) M SVD None  Y     Comments: WNL  2 PRE 2011 [redacted]w[redacted]d  3.345 kg (7 lb 6 oz) M SVD   Y  1 SAB               Past Surgical History: Past Surgical History  Procedure Laterality Date  . Dilation and curettage of uterus      Family History: Family History  Problem Relation Age of Onset  . Anesthesia problems Neg Hx   . Diabetes Father   . Birth defects Brother     spina bifida  . Diabetes Paternal Aunt   . Diabetes Paternal Uncle   . Cancer Paternal Grandmother     breast and thyroid, stomach  . Diabetes Paternal Grandmother     Social History: History  Substance Use Topics  . Smoking status:  Never Smoker   . Smokeless tobacco: Never Used  . Alcohol Use: No    Allergies: No Known Allergies  Meds:  No prescriptions prior to admission    ROS: Pertinent findings in history of present illness.  Physical Exam  Blood pressure 111/66, pulse 91, temperature 97.9 F (36.6 C), temperature source Oral, resp. rate 18, height 5\' 2"  (1.575 m), weight 66.679 kg (147 lb), last menstrual period 05/17/2013, SpO2 100.00%, unknown if currently breastfeeding. GENERAL: Well-developed, well-nourished female in no acute distress.  HEENT: normocephalic HEART: normal rate RESP: normal effort ABDOMEN: Soft, non-tender, gravid appropriate for gestational age EXTREMITIES: Nontender, no edema NEURO: alert and oriented Pelvic exam: Cervix pink, visually closed, without lesion, scant white creamy discharge, vaginal walls and external genitalia normal  Cervix FT/long/high, posterior, soft  FHT:  Baseline 145 , moderate variability, accelerations present, no decelerations Contractions: some irritability noted, mild to palpation  FFN sent r/t FT dilation and hx preterm delivery    Labs: Results for orders placed during the hospital encounter of 11/10/13 (from the past 24 hour(s))  URINALYSIS,  ROUTINE W REFLEX MICROSCOPIC     Status: None   Collection Time    11/10/13  8:32 PM      Result Value Ref Range   Color, Urine YELLOW  YELLOW   APPearance CLEAR  CLEAR   Specific Gravity, Urine 1.015  1.005 - 1.030   pH 7.0  5.0 - 8.0   Glucose, UA NEGATIVE  NEGATIVE mg/dL   Hgb urine dipstick NEGATIVE  NEGATIVE   Bilirubin Urine NEGATIVE  NEGATIVE   Ketones, ur NEGATIVE  NEGATIVE mg/dL   Protein, ur NEGATIVE  NEGATIVE mg/dL   Urobilinogen, UA 1.0  0.0 - 1.0 mg/dL   Nitrite NEGATIVE  NEGATIVE   Leukocytes, UA NEGATIVE  NEGATIVE  WET PREP, GENITAL     Status: Abnormal   Collection Time    11/10/13  9:07 PM      Result Value Ref Range   Yeast Wet Prep HPF POC NONE SEEN  NONE SEEN   Trich, Wet  Prep NONE SEEN  NONE SEEN   Clue Cells Wet Prep HPF POC NONE SEEN  NONE SEEN   WBC, Wet Prep HPF POC MANY (*) NONE SEEN  FETAL FIBRONECTIN     Status: None   Collection Time    11/10/13  9:07 PM      Result Value Ref Range   Fetal Fibronectin NEGATIVE  NEGATIVE    Assessment: 1. Back pain complicating pregnancy in second trimester   2. Abdominal pain in pregnancy   3. Near syncope     Plan: Tylenol 1000 mg PO x1 dose in MAU Reviewed negative FFN, wet prep, and UA with pt Discharge home PTL precautions and fetal kick counts Phenergan 12.5-25 mg PO Q 6 hours PRN Increase PO fluids F/U with Dr Clearance CootsHarper Return to MAU as needed      Follow-up Information   Follow up with Brock BadHARPER,CHARLES A, MD.   Specialty:  Obstetrics and Gynecology   Contact information:   [address] [place] [id number]        Medication List         promethazine 25 MG tablet  Commonly known as:  PHENERGAN  Take 0.5-1 tablets (12.5-25 mg total) by mouth every 6 (six) hours as needed for nausea or vomiting.        Sharen CounterLisa Leftwich-Kirby Certified Nurse-Midwife 11/10/2013 10:15 PM

## 2013-11-10 NOTE — Discharge Instructions (Signed)
Abdominal Pain During Pregnancy Abdominal pain is common in pregnancy. Most of the time, it does not cause harm. There are many causes of abdominal pain. Some causes are more serious than others. Some of the causes of abdominal pain in pregnancy are easily diagnosed. Occasionally, the diagnosis takes time to understand. Other times, the cause is not determined. Abdominal pain can be a sign that something is very wrong with the pregnancy, or the pain may have nothing to do with the pregnancy at all. For this reason, always tell your health care provider if you have any abdominal discomfort. HOME CARE INSTRUCTIONS  Monitor your abdominal pain for any changes. The following actions may help to alleviate any discomfort you are experiencing:  Do not have sexual intercourse or put anything in your vagina until your symptoms go away completely.  Get plenty of rest until your pain improves.  Drink clear fluids if you feel nauseous. Avoid solid food as long as you are uncomfortable or nauseous.  Only take over-the-counter or prescription medicine as directed by your health care provider.  Keep all follow-up appointments with your health care provider. SEEK IMMEDIATE MEDICAL CARE IF:  You are bleeding, leaking fluid, or passing tissue from the vagina.  You have increasing pain or cramping.  You have persistent vomiting.  You have painful or bloody urination.  You have a fever.  You notice a decrease in your baby's movements.  You have extreme weakness or feel faint.  You have shortness of breath, with or without abdominal pain.  You develop a severe headache with abdominal pain.  You have abnormal vaginal discharge with abdominal pain.  You have persistent diarrhea.  You have abdominal pain that continues even after rest, or gets worse. MAKE SURE YOU:   Understand these instructions.  Will watch your condition.  Will get help right away if you are not doing well or get  worse. Document Released: 09/07/2005 Document Revised: 06/28/2013 Document Reviewed: 04/06/2013 [facility] Patient Information 2014 [place], [place]. Back Pain in Pregnancy Back pain during pregnancy is common. It happens in about half of all pregnancies. It is important for you and your baby that you remain active during your pregnancy.If you feel that back pain is not allowing you to remain active or sleep well, it is time to see your caregiver. Back pain may be caused by several factors related to changes during your pregnancy.Fortunately, unless you had trouble with your back before your pregnancy, the pain is likely to get better after you deliver. Low back pain usually occurs between the fifth and seventh months of pregnancy. It can, however, happen in the first couple months. Factors that increase the risk of back problems include:   Previous back problems.  Injury to your back.  Having twins or multiple births.  A chronic cough.  Stress.  Job-related repetitive motions.  Muscle or spinal disease in the back.  Family history of back problems, ruptured (herniated) discs, or osteoporosis.  Depression, anxiety, and panic attacks. CAUSES   When you are pregnant, your body produces a hormone called relaxin. This hormonemakes the ligaments connecting the low back and pubic bones more flexible. This flexibility allows the baby to be delivered more easily. When your ligaments are loose, your muscles need to work harder to support your back. Soreness in your back can come from tired muscles. Soreness can also come from back tissues that are irritated since they are receiving less support.  As the baby grows, it puts pressure on  the nerves and blood vessels in your pelvis. This can cause back pain.  As the baby grows and gets heavier during pregnancy, the uterus pushes the stomach muscles forward and changes your center of gravity. This makes your back muscles work harder to maintain good  posture. SYMPTOMS  Lumbar pain during pregnancy Lumbar pain during pregnancy usually occurs at or above the waist in the center of the back. There may be pain and numbness that radiates into your leg or foot. This is similar to low back pain experienced by non-pregnant women. It usually increases with sitting for long periods of time, standing, or repetitive lifting. Tenderness may also be present in the muscles along your upper back. Posterior pelvic pain during pregnancy Pain in the back of the pelvis is more common than lumbar pain in pregnancy. It is a deep pain felt in your side at the waistline, or across the tailbone (sacrum), or in both places. You may have pain on one or both sides. This pain can also go into the buttocks and backs of the upper thighs. Pubic and groin pain may also be present. The pain does not quickly resolve with rest, and morning stiffness may also be present. Pelvic pain during pregnancy can be brought on by most activities. A high level of fitness before and during pregnancy may or may not prevent this problem. Labor pain is usually 1 to 2 minutes apart, lasts for about 1 minute, and involves a bearing down feeling or pressure in your pelvis. However, if you are at term with the pregnancy, constant low back pain can be the beginning of early labor, and you should be aware of this. DIAGNOSIS  X-rays of the back should not be done during the first 12 to 14 weeks of the pregnancy and only when absolutely necessary during the rest of the pregnancy. MRIs do not give off radiation and are safe during pregnancy. MRIs also should only be done when absolutely necessary. HOME CARE INSTRUCTIONS  Exercise as directed by your caregiver. Exercise is the most effective way to prevent or manage back pain. If you have a back problem, it is especially important to avoid sports that require sudden body movements. Swimming and walking are great activities.  Do not stand in one place for long  periods of time.  Do not wear high heels.  Sit in chairs with good posture. Use a pillow on your lower back if necessary. Make sure your head rests over your shoulders and is not hanging forward.  Try sleeping on your side, preferably the left side, with a pillow or two between your legs. If you are sore after a night's rest, your bedmay betoo soft.Try placing a board between your mattress and box spring.  Listen to your body when lifting.If you are experiencing pain, ask for help or try bending yourknees more so you can use your leg muscles rather than your back muscles. Squat down when picking up something from the floor. Do not bend over.  Eat a healthy diet. Try to gain weight within your caregiver's recommendations.  Use heat or cold packs 3 to 4 times a day for 15 minutes to help with the pain.  Only take over-the-counter or prescription medicines for pain, discomfort, or fever as directed by your caregiver. Sudden (acute) back pain  Use bed rest for only the most extreme, acute episodes of back pain. Prolonged bed rest over 48 hours will aggravate your condition.  Ice is very effective for acute  conditions.  Put ice in a plastic bag.  Place a towel between your skin and the bag.  Leave the ice on for 10 to 20 minutes every 2 hours, or as needed.  Using heat packs for 30 minutes prior to activities is also helpful. Continued back pain See your caregiver if you have continued problems. Your caregiver can help or refer you for appropriate physical therapy. With conditioning, most back problems can be avoided. Sometimes, a more serious issue may be the cause of back pain. You should be seen right away if new problems seem to be developing. Your caregiver may recommend:  A maternity girdle.  An elastic sling.  A back brace.  A massage therapist or acupuncture. SEEK MEDICAL CARE IF:   You are not able to do most of your daily activities, even when taking the pain  medicine you were given.  You need a referral to a physical therapist or chiropractor.  You want to try acupuncture. SEEK IMMEDIATE MEDICAL CARE IF:  You develop numbness, tingling, weakness, or problems with the use of your arms or legs.  You develop severe back pain that is no longer relieved with medicines.  You have a sudden change in bowel or bladder control.  You have increasing pain in other areas of the body.  You develop shortness of breath, dizziness, or fainting.  You develop nausea, vomiting, or sweating.  You have back pain which is similar to labor pains.  You have back pain along with your water breaking or vaginal bleeding.  You have back pain or numbness that travels down your leg.  Your back pain developed after you fell.  You develop pain on one side of your back. You may have a kidney stone.  You see blood in your urine. You may have a bladder infection or kidney stone.  You have back pain with blisters. You may have shingles. Back pain is fairly common during pregnancy but should not be accepted as just part of the process. Back pain should always be treated as soon as possible. This will make your pregnancy as pleasant as possible. Document Released: 12/16/2005 Document Revised: 11/30/2011 Document Reviewed: 01/27/2011 [facility] Patient Information 2014 [place], [place].

## 2013-11-10 NOTE — MAU Note (Signed)
Patient presents with complaints of abdominal and back for the last week. Was seen at [facility] yesterday, was given IVFs and sent home.  States today she passed out between one and two o'clock while eating, no LOC. Been feeling  dizzy and like the room was spinning. Is also having a white thick vaginal discharge for the last couple of days, denies odor and itching but states it might be an infection. Denies vaginal bleeding and LOF. Good fetal movement.

## 2013-11-11 LAB — GC/CHLAMYDIA PROBE AMP
CT Probe RNA: NEGATIVE
GC Probe RNA: NEGATIVE

## 2013-11-14 ENCOUNTER — Ambulatory Visit (INDEPENDENT_AMBULATORY_CARE_PROVIDER_SITE_OTHER): Payer: Medicaid Other | Admitting: Obstetrics

## 2013-11-14 ENCOUNTER — Ambulatory Visit (HOSPITAL_COMMUNITY): Payer: Medicaid Other

## 2013-11-14 ENCOUNTER — Encounter: Payer: Self-pay | Admitting: Obstetrics

## 2013-11-14 VITALS — BP 112/71 | Wt 152.0 lb

## 2013-11-14 DIAGNOSIS — Z6791 Unspecified blood type, Rh negative: Secondary | ICD-10-CM

## 2013-11-14 DIAGNOSIS — Z3480 Encounter for supervision of other normal pregnancy, unspecified trimester: Secondary | ICD-10-CM

## 2013-11-14 DIAGNOSIS — O26893 Other specified pregnancy related conditions, third trimester: Secondary | ICD-10-CM

## 2013-11-14 DIAGNOSIS — M549 Dorsalgia, unspecified: Secondary | ICD-10-CM

## 2013-11-14 LAB — POCT URINALYSIS DIPSTICK
Bilirubin, UA: NEGATIVE
Blood, UA: NEGATIVE
Ketones, UA: NEGATIVE
Leukocytes, UA: NEGATIVE
Nitrite, UA: NEGATIVE
Protein, UA: NEGATIVE
Spec Grav, UA: 1.015
Urobilinogen, UA: NEGATIVE
pH, UA: 7

## 2013-11-14 MED ORDER — RHO D IMMUNE GLOBULIN 300 MCG IM INJ: 300.0000 ug | INJECTION | Freq: Once | INTRAMUSCULAR | Status: DC

## 2013-11-14 MED ORDER — CYCLOBENZAPRINE HCL 10 MG PO TABS: 10.0000 mg | ORAL_TABLET | Freq: Three times a day (TID) | ORAL | Status: DC | PRN

## 2013-11-14 NOTE — Addendum Note (Signed)
Addended by: Coral CeoHARPER, CHARLES A on: 11/14/2013 05:00 PM   Modules accepted: Orders

## 2013-11-14 NOTE — Progress Notes (Signed)
Pulse 90 Pt states that she is having some increase in back pain.  Pt was seen in hospital for this and was given tylenol 1000mg  and home reccommendations.

## 2013-11-21 ENCOUNTER — Other Ambulatory Visit: Payer: Medicaid Other

## 2013-11-23 ENCOUNTER — Encounter: Payer: Self-pay | Admitting: Obstetrics

## 2013-11-24 ENCOUNTER — Other Ambulatory Visit: Payer: Self-pay | Admitting: Obstetrics

## 2013-11-24 DIAGNOSIS — O350XX0 Maternal care for (suspected) central nervous system malformation in fetus, not applicable or unspecified: Secondary | ICD-10-CM

## 2013-11-24 DIAGNOSIS — O3503X0 Maternal care for (suspected) central nervous system malformation or damage in fetus, choroid plexus cysts, not applicable or unspecified: Secondary | ICD-10-CM

## 2013-11-28 ENCOUNTER — Encounter (HOSPITAL_COMMUNITY): Payer: Self-pay

## 2013-11-28 ENCOUNTER — Ambulatory Visit (INDEPENDENT_AMBULATORY_CARE_PROVIDER_SITE_OTHER): Payer: Medicaid Other | Admitting: Obstetrics

## 2013-11-28 ENCOUNTER — Ambulatory Visit (HOSPITAL_COMMUNITY)
Admission: RE | Admit: 2013-11-28 | Discharge: 2013-11-28 | Disposition: A | Discharge status: Home / Self Care | Payer: Medicaid Other | Source: Ambulatory Visit | Attending: Obstetrics | Admitting: Obstetrics

## 2013-11-28 ENCOUNTER — Encounter: Payer: Medicaid Other | Admitting: Obstetrics

## 2013-11-28 ENCOUNTER — Encounter: Payer: Self-pay | Admitting: Obstetrics

## 2013-11-28 VITALS — BP 105/55 | HR 87 | Wt 154.0 lb

## 2013-11-28 VITALS — BP 102/67 | Temp 97.4°F | Wt 154.0 lb

## 2013-11-28 DIAGNOSIS — B373 Candidiasis of vulva and vagina: Secondary | ICD-10-CM

## 2013-11-28 DIAGNOSIS — O350XX0 Maternal care for (suspected) central nervous system malformation in fetus, not applicable or unspecified: Secondary | ICD-10-CM

## 2013-11-28 DIAGNOSIS — O3503X0 Maternal care for (suspected) central nervous system malformation or damage in fetus, choroid plexus cysts, not applicable or unspecified: Secondary | ICD-10-CM

## 2013-11-28 DIAGNOSIS — B3731 Acute candidiasis of vulva and vagina: Secondary | ICD-10-CM

## 2013-11-28 DIAGNOSIS — Z3689 Encounter for other specified antenatal screening: Secondary | ICD-10-CM | POA: Insufficient documentation

## 2013-11-28 DIAGNOSIS — O358XX0 Maternal care for other (suspected) fetal abnormality and damage, not applicable or unspecified: Secondary | ICD-10-CM | POA: Insufficient documentation

## 2013-11-28 DIAGNOSIS — Z3480 Encounter for supervision of other normal pregnancy, unspecified trimester: Secondary | ICD-10-CM

## 2013-11-28 DIAGNOSIS — IMO0002 Reserved for concepts with insufficient information to code with codable children: Secondary | ICD-10-CM

## 2013-11-28 LAB — POCT URINALYSIS DIPSTICK
Bilirubin, UA: NEGATIVE
Blood, UA: NEGATIVE
Glucose, UA: NEGATIVE
Ketones, UA: NEGATIVE
Leukocytes, UA: NEGATIVE
Nitrite, UA: NEGATIVE
Spec Grav, UA: 1.01
Urobilinogen, UA: NEGATIVE
pH, UA: 7

## 2013-11-28 MED ORDER — FLUCONAZOLE 150 MG PO TABS: 150.0000 mg | ORAL_TABLET | Freq: Once | ORAL | Status: DC

## 2013-11-28 NOTE — Progress Notes (Signed)
Pulse: 91  Patient states she is having lower abdominal and back pain. Patient states she is having itching, and a thick white/clear discharge. Patient denies any burning, irritation or vaginal odor. Patient states that her urine dark orange color. Patient states she itches all over and would like to know if there is a specific lotion she can use.

## 2013-11-29 LAB — GC/CHLAMYDIA PROBE AMP
CT Probe RNA: NEGATIVE
GC Probe RNA: NEGATIVE

## 2013-11-29 LAB — WET PREP BY MOLECULAR PROBE
Candida species: NEGATIVE
Gardnerella vaginalis: NEGATIVE
Trichomonas vaginosis: NEGATIVE

## 2013-12-04 ENCOUNTER — Other Ambulatory Visit: Payer: Medicaid Other

## 2013-12-04 ENCOUNTER — Ambulatory Visit (INDEPENDENT_AMBULATORY_CARE_PROVIDER_SITE_OTHER): Payer: Medicaid Other | Admitting: Obstetrics

## 2013-12-04 ENCOUNTER — Encounter: Payer: Self-pay | Admitting: Obstetrics

## 2013-12-04 VITALS — BP 113/69 | Temp 97.3°F | Wt 155.0 lb

## 2013-12-04 DIAGNOSIS — O360990 Maternal care for other rhesus isoimmunization, unspecified trimester, not applicable or unspecified: Secondary | ICD-10-CM

## 2013-12-04 DIAGNOSIS — Z3480 Encounter for supervision of other normal pregnancy, unspecified trimester: Secondary | ICD-10-CM

## 2013-12-04 DIAGNOSIS — Z6791 Unspecified blood type, Rh negative: Principal | ICD-10-CM

## 2013-12-04 DIAGNOSIS — O26899 Other specified pregnancy related conditions, unspecified trimester: Secondary | ICD-10-CM

## 2013-12-04 LAB — POCT URINALYSIS DIPSTICK
Bilirubin, UA: NEGATIVE
Blood, UA: NEGATIVE
Glucose, UA: NEGATIVE
Ketones, UA: NEGATIVE
Leukocytes, UA: NEGATIVE
Nitrite, UA: NEGATIVE
Protein, UA: NEGATIVE
Spec Grav, UA: 1.015
Urobilinogen, UA: NEGATIVE
pH, UA: 7

## 2013-12-04 LAB — CBC
HCT: 34.7 % — ABNORMAL LOW (ref 36.0–46.0)
Hemoglobin: 11.5 g/dL — ABNORMAL LOW (ref 12.0–15.0)
MCH: 27.1 pg (ref 26.0–34.0)
MCHC: 33.1 g/dL (ref 30.0–36.0)
MCV: 81.8 fL (ref 78.0–100.0)
Platelets: 239 10*3/uL (ref 150–400)
RBC: 4.24 MIL/uL (ref 3.87–5.11)
RDW: 14 % (ref 11.5–15.5)
WBC: 7.3 10*3/uL (ref 4.0–10.5)

## 2013-12-04 MED ORDER — RHO D IMMUNE GLOBULIN 1500 UNIT/2ML IJ SOLN
300.0000 ug | Freq: Once | INTRAMUSCULAR | Status: AC
  Administered 2013-12-04: 300 ug via INTRAMUSCULAR

## 2013-12-04 NOTE — Progress Notes (Signed)
Pulse -89 Pt states she is having discomfort in her lower abdomen. Patient states she was seen at [facility] because she had not felt the baby moved in two days. Patient states she had some irritability on the monitor.

## 2013-12-05 LAB — RPR

## 2013-12-05 LAB — GLUCOSE TOLERANCE, 2 HOURS W/ 1HR
Glucose, 1 hour: 169 mg/dL (ref 70–170)
Glucose, 2 hour: 140 mg/dL — ABNORMAL HIGH (ref 70–139)
Glucose, Fasting: 58 mg/dL — ABNORMAL LOW (ref 70–99)

## 2013-12-05 LAB — HIV ANTIBODY (ROUTINE TESTING W REFLEX): HIV: NONREACTIVE

## 2013-12-18 ENCOUNTER — Ambulatory Visit (INDEPENDENT_AMBULATORY_CARE_PROVIDER_SITE_OTHER): Payer: Medicaid Other | Admitting: Obstetrics

## 2013-12-18 VITALS — BP 122/79 | Temp 96.8°F | Wt 159.0 lb

## 2013-12-18 DIAGNOSIS — Z3480 Encounter for supervision of other normal pregnancy, unspecified trimester: Secondary | ICD-10-CM

## 2013-12-18 LAB — POCT URINALYSIS DIPSTICK
Bilirubin, UA: NEGATIVE
Blood, UA: NEGATIVE
Glucose, UA: NEGATIVE
Ketones, UA: NEGATIVE
Leukocytes, UA: NEGATIVE
Nitrite, UA: NEGATIVE
Protein, UA: NEGATIVE
Spec Grav, UA: <=1.005
Urobilinogen, UA: NEGATIVE
pH, UA: 7

## 2013-12-18 LAB — OB RESULTS CONSOLE GC/CHLAMYDIA
Chlamydia: NEGATIVE
Gonorrhea: NEGATIVE

## 2013-12-18 NOTE — Progress Notes (Signed)
Pulse- 90 Pt states she is having pain shooting up her back. Pt states she is having a white discharge with itching and irritation.

## 2013-12-19 LAB — WET PREP BY MOLECULAR PROBE
Candida species: NEGATIVE
Gardnerella vaginalis: NEGATIVE
Trichomonas vaginosis: NEGATIVE

## 2013-12-19 LAB — GC/CHLAMYDIA PROBE AMP
CT Probe RNA: NEGATIVE
GC Probe RNA: NEGATIVE

## 2013-12-23 ENCOUNTER — Encounter: Payer: Self-pay | Admitting: Obstetrics

## 2013-12-28 ENCOUNTER — Inpatient Hospital Stay (HOSPITAL_COMMUNITY)
Admission: AD | Admit: 2013-12-28 | Discharge: 2013-12-31 | DRG: 778 | Disposition: A | Discharge status: Home / Self Care | Payer: Medicaid Other | Source: Ambulatory Visit | Attending: Obstetrics | Admitting: Obstetrics

## 2013-12-28 DIAGNOSIS — O09219 Supervision of pregnancy with history of pre-term labor, unspecified trimester: Secondary | ICD-10-CM

## 2013-12-28 DIAGNOSIS — M25559 Pain in unspecified hip: Secondary | ICD-10-CM | POA: Diagnosis present

## 2013-12-28 DIAGNOSIS — O409XX0 Polyhydramnios, unspecified trimester, not applicable or unspecified: Secondary | ICD-10-CM | POA: Diagnosis present

## 2013-12-28 DIAGNOSIS — O360990 Maternal care for other rhesus isoimmunization, unspecified trimester, not applicable or unspecified: Secondary | ICD-10-CM | POA: Diagnosis present

## 2013-12-28 DIAGNOSIS — N949 Unspecified condition associated with female genital organs and menstrual cycle: Secondary | ICD-10-CM | POA: Diagnosis present

## 2013-12-28 DIAGNOSIS — IMO0002 Reserved for concepts with insufficient information to code with codable children: Secondary | ICD-10-CM

## 2013-12-28 DIAGNOSIS — O26899 Other specified pregnancy related conditions, unspecified trimester: Secondary | ICD-10-CM

## 2013-12-28 DIAGNOSIS — O350XX0 Maternal care for (suspected) central nervous system malformation in fetus, not applicable or unspecified: Secondary | ICD-10-CM

## 2013-12-28 DIAGNOSIS — Z6791 Unspecified blood type, Rh negative: Secondary | ICD-10-CM

## 2013-12-28 DIAGNOSIS — O4700 False labor before 37 completed weeks of gestation, unspecified trimester: Principal | ICD-10-CM | POA: Diagnosis present

## 2013-12-28 LAB — CBC
HCT: 31.9 % — ABNORMAL LOW (ref 36.0–46.0)
Hemoglobin: 10.2 g/dL — ABNORMAL LOW (ref 12.0–15.0)
MCH: 25.8 pg — ABNORMAL LOW (ref 26.0–34.0)
MCHC: 32 g/dL (ref 30.0–36.0)
MCV: 80.8 fL (ref 78.0–100.0)
Platelets: 225 10*3/uL (ref 150–400)
RBC: 3.95 MIL/uL (ref 3.87–5.11)
RDW: 14.6 % (ref 11.5–15.5)
WBC: 11.5 10*3/uL — ABNORMAL HIGH (ref 4.0–10.5)

## 2013-12-28 LAB — URINALYSIS, ROUTINE W REFLEX MICROSCOPIC
Bilirubin Urine: NEGATIVE
Glucose, UA: NEGATIVE mg/dL
Hgb urine dipstick: NEGATIVE
Ketones, ur: NEGATIVE mg/dL
Leukocytes, UA: NEGATIVE
Nitrite: NEGATIVE
Protein, ur: NEGATIVE mg/dL
Specific Gravity, Urine: 1.025 (ref 1.005–1.030)
Urobilinogen, UA: 0.2 mg/dL (ref 0.0–1.0)
pH: 7 (ref 5.0–8.0)

## 2013-12-28 MED ORDER — MAGNESIUM SULFATE 40 G IN LACTATED RINGERS - SIMPLE
2.0000 g/h | INTRAVENOUS | Status: DC
  Administered 2013-12-28 – 2013-12-29 (×2): 2 g/h via INTRAVENOUS
  Filled 2013-12-28 (×2): qty 500

## 2013-12-28 MED ORDER — PRENATAL MULTIVITAMIN CH
1.0000 | ORAL_TABLET | Freq: Every day | ORAL | Status: DC
  Administered 2013-12-28 – 2013-12-30 (×3): 1 via ORAL
  Filled 2013-12-28 (×3): qty 1

## 2013-12-28 MED ORDER — ACETAMINOPHEN 325 MG PO TABS
650.0000 mg | ORAL_TABLET | ORAL | Status: DC | PRN
  Administered 2013-12-28 – 2013-12-29 (×3): 650 mg via ORAL
  Filled 2013-12-28 (×2): qty 2

## 2013-12-28 MED ORDER — BETAMETHASONE SOD PHOS & ACET 6 (3-3) MG/ML IJ SUSP
12.0000 mg | Freq: Once | INTRAMUSCULAR | Status: AC
  Administered 2013-12-28: 12 mg via INTRAMUSCULAR
  Filled 2013-12-28: qty 2

## 2013-12-28 MED ORDER — DOCUSATE SODIUM 100 MG PO CAPS
100.0000 mg | ORAL_CAPSULE | Freq: Every day | ORAL | Status: DC
  Administered 2013-12-28: 100 mg via ORAL
  Filled 2013-12-28: qty 1

## 2013-12-28 MED ORDER — CALCIUM CARBONATE ANTACID 500 MG PO CHEW
2.0000 | CHEWABLE_TABLET | ORAL | Status: DC | PRN
  Administered 2013-12-28 – 2013-12-30 (×4): 400 mg via ORAL
  Filled 2013-12-28 (×5): qty 1

## 2013-12-28 MED ORDER — ZOLPIDEM TARTRATE 5 MG PO TABS: 5.0000 mg | ORAL_TABLET | Freq: Every evening | ORAL | Status: DC | PRN

## 2013-12-28 MED ORDER — LACTATED RINGERS IV SOLN
INTRAVENOUS | Status: DC
  Administered 2013-12-28 (×3): via INTRAVENOUS

## 2013-12-28 MED ORDER — DEXTROSE 5 % IV SOLN
2.0000 g | Freq: Four times a day (QID) | INTRAVENOUS | Status: DC
  Administered 2013-12-28 – 2013-12-31 (×13): 2 g via INTRAVENOUS
  Filled 2013-12-28 (×14): qty 2

## 2013-12-28 NOTE — Progress Notes (Signed)
UR completed 

## 2013-12-28 NOTE — H&P (Signed)
Victoria Humphrey is a 23 y.o. female presenting for UC's. Maternal Medical History:  Reason for admission: Contractions.  23 yo.  G4 [id number].  EDC  02-21-14.  Fetal activity: Perceived fetal activity is normal.   Last perceived fetal movement was within the past hour.    Prenatal complications: Preterm labor.   Prenatal Complications - Diabetes: none.    OB History   Grav Para Term Preterm Abortions TAB SAB Ect Mult Living   4 2 1 1 1  1   2      Past Medical History  Diagnosis Date  . Migraine headache    Past Surgical History  Procedure Laterality Date  . Dilation and curettage of uterus     Family History: family history includes Birth defects in her brother; Cancer in her paternal grandmother; Diabetes in her father, paternal aunt, paternal grandmother, and paternal uncle. There is no history of Anesthesia problems. Social History:  reports that she has never smoked. She has never used smokeless tobacco. She reports that she does not drink alcohol or use illicit drugs.   Prenatal Transfer Tool  Maternal Diabetes: No Genetic Screening: Normal Maternal Ultrasounds/Referrals: Normal Fetal Ultrasounds or other Referrals:  None Maternal Substance Abuse:  No Significant Maternal Medications:  None Significant Maternal Lab Results:  None Other Comments:  None  Review of Systems  All other systems reviewed and are negative.     Blood pressure 109/52, pulse 109, temperature 98.7 F (37.1 C), temperature source Oral, resp. rate 20, height 5\' 2"  (1.575 m), weight 163 lb (73.936 kg), last menstrual period 05/17/2013, SpO2 92.00%, unknown if currently breastfeeding. Maternal Exam:  Uterine Assessment: Contraction strength is moderate.  Abdomen: Patient reports no abdominal tenderness. Fetal presentation: vertex  Pelvis: adequate for delivery.   Cervix: Cervix evaluated by digital exam.     Physical Exam  Nursing note and vitals reviewed. Constitutional: She is oriented to  person, place, and time. She appears well-developed and well-nourished.  HENT:  Head: Normocephalic and atraumatic.  Eyes: Conjunctivae are normal. Pupils are equal, round, and reactive to light.  Neck: Normal range of motion. Neck supple.  Cardiovascular: Normal rate and regular rhythm.   Respiratory: Effort normal and breath sounds normal.  GI: Soft. Bowel sounds are normal.  Genitourinary: Vagina normal and uterus normal.  Musculoskeletal: Normal range of motion.  Neurological: She is alert and oriented to person, place, and time.  Skin: Skin is warm and dry.  Psychiatric: She has a normal mood and affect. Her behavior is normal. Judgment and thought content normal.    Prenatal labs: ABO, Rh: O/NEG/-- (10/20 1517) Antibody: NEG (10/20 1517) Rubella: 2.57 (10/20 1517) RPR: NON REAC (03/16 1007)  HBsAg: NEGATIVE (10/20 1517)  HIV: NON REACTIVE (03/16 1007)  GBS:     Assessment/Plan: 32.1 weeks.  PTL.  Stable.  Continue magnesium sulfate.   Brock Badharles A Harper 12/28/2013, 9:33 AM

## 2013-12-29 ENCOUNTER — Encounter (HOSPITAL_COMMUNITY)
Admit: 2013-12-29 | Discharge: 2013-12-29 | Disposition: A | Discharge status: Home / Self Care | Payer: Medicaid Other | Attending: Advanced Practice Midwife | Admitting: Advanced Practice Midwife

## 2013-12-29 DIAGNOSIS — O26899 Other specified pregnancy related conditions, unspecified trimester: Secondary | ICD-10-CM

## 2013-12-29 DIAGNOSIS — Z6791 Unspecified blood type, Rh negative: Secondary | ICD-10-CM

## 2013-12-29 LAB — URINE CULTURE
Colony Count: NO GROWTH
Culture: NO GROWTH

## 2013-12-29 NOTE — Progress Notes (Signed)
Pt instructed to void and turn to her left side, due to contractions noted on toco

## 2013-12-29 NOTE — Progress Notes (Signed)
Victoria Humphrey is a 23 y.o. [id number] at 2927w2d by LMP consistent w/ 11 wk US admitted for Preterm labor  Subjective: Patient reports lower pelvic pain. States she has significant pain and pressure in her hips. Reports that she has contractions but states she stopped keeping track of them. Denies current vaginal bleeding or LOF. Reports fetal movement last night.  States she has financial and work concerns r/t to being here and possibly needing to be on bedrest. Would like to know what our plans are following d/c.  Patient denies SOB, vision changes.  Objective: BP 124/56  Pulse 107  Temp(Src) 97.2 F (36.2 C) (Oral)  Resp 18  Ht 5\' 2"  (1.575 m)  Wt 163 lb (73.936 kg)  BMI 29.81 kg/m2  SpO2 92%  LMP 05/17/2013 I/O last 3 completed shifts: In: 7565 [P.O.:3990; I.V.:3575] Out: 7550 [Urine:7550]  Physical Examination: General appearance - alert, well appearing, and in no distress and oriented to person, place, and time Mental status - alert, oriented to person, place, and time Chest - clear to auscultation, no wheezes, rales or rhonchi, symmetric air entry Heart - normal rate, regular rhythm, normal S1, S2, no murmurs, rubs, clicks or gallops Neurological - alert, oriented, normal speech, no focal findings or movement disorder noted Skin - normal coloration and turgor, no rashes, no suspicious skin lesions noted Abdomen soft- patient reports tenderness in right and left lower quadrents    FHT:  FHR: 125 bpm, variability: moderate,  accelerations:  Present,  decelerations:  Absent UC:   irregular, every 5-10 minutes SVE:    deferred  Labs: Lab Results  Component Value Date   WBC 11.5* 12/28/2013   HGB 10.2* 12/28/2013   HCT 31.9* 12/28/2013   MCV 80.8 12/28/2013   PLT 225 12/28/2013    Assessment / Plan: Preterm contractions; D/C MgSO4 at this time; MFM consult and US S/P Betamethasone x2 RH Negative, received Rhogam Social Concerns/Stress r/t finances and work Hx of preterm  delivery @ 36 weeks Cat 1 FHR last evening, NST at this time Cont IV antibiotics per MD Clearance CootsHarper   Labor: Preterm contractions, not in active labor Preeclampsia:  NA Fetal Wellbeing:  Category I Pain Control:  Nothing at this time I/D:  n/a   Victoria Humphrey 12/29/2013, 8:49 AM

## 2013-12-29 NOTE — Consult Note (Signed)
MFM Note  [name] is a 23 year old [id number] Caucasian female at 32+2 weeks who was transferred from [facility] and directly admitted to the antenatal unit for preterm labor. Wednesday night she presented to [facility] c/o shortness of breath. Contractions were seen on the monitor and it was thought that her cervical exam changed ie. Preterm labor. #1 BMZ was given and she was transported. Magnesium was continued until ~9 this AM. She also received IV antibiotics.  Currently, [name] is not contracting regularly. Her [facility] She received her second dose of BMZ.  OB history: G1: preterm labor at 4936 weeks                    G2: SAB                    G3: Term SVD                    G4: current - complicated by back and hip pain  Ultrasound today:  SIUP at 32+2 weeks Normal interval anatomy; anatomic survey complete High normal amniotic fluid volume vs mild polyhydramnios Appropriate interval growth with EFW at the 73rd %tile    Assessment: 1) IUP at 32+2 weeks 2) Possible PTL verses preterm contractions 3) H/O preterm delivery - 36 week   4) Mild polyhydramnios or high normal AFV  Recommendations: 1) Perform cervical exam; consider fetal fibronectin prior to exam 2) If exam OK, consider discharging patient on Procardia 10-20 mgs po every 6-8 hours (could be prn) or could try Procardia now and reassess in AM for outpatient management 3) Weekly BPPs for polyhydramnios; ultrasound in 4-5 weeks  Please call with questions or concerns.  (Face-to-face consultation with patient: 30 min)

## 2013-12-30 LAB — CULTURE, BETA STREP (GROUP B ONLY)

## 2013-12-30 LAB — FETAL FIBRONECTIN: Fetal Fibronectin: NEGATIVE

## 2013-12-30 MED ORDER — NIFEDIPINE ER 30 MG PO TB24
30.0000 mg | ORAL_TABLET | Freq: Two times a day (BID) | ORAL | Status: DC
  Administered 2013-12-30 – 2013-12-31 (×3): 30 mg via ORAL
  Filled 2013-12-30 (×3): qty 1

## 2013-12-30 NOTE — Progress Notes (Signed)
Patient ID: Victoria Humphrey, female   DOB: 09-15-1991, 23 y.o.   MRN: [id number] Hospital Day: 3  S: Preterm labor symptoms: UC's  O: Blood pressure 104/61, pulse 104, temperature 98.3 F (36.8 C), temperature source Oral, resp. rate 18, height 5\' 2"  (1.575 m), weight 163 lb (73.936 kg), last menstrual period 05/17/2013, SpO2 99.00%, unknown if currently breastfeeding.   NFA:[id number]:Baseline: 150 bpm Toco: Occasional UC's. SVE:   A/P- 23 y.o. admitted with:  UC's.  Stable.  Continue observation.  Present on Admission:  . Preterm labor in second trimester without delivery  Pregnancy Complications: preterm labor   Preterm labor management: bedrest advised Dating:  6863w3d PNL Needed:  none FWB:  good PTL:  stable ROD: spontaneous vaginal

## 2013-12-31 MED ORDER — FLUCONAZOLE 150 MG PO TABS
150.0000 mg | ORAL_TABLET | Freq: Once | ORAL | Status: AC
  Administered 2013-12-31: 150 mg via ORAL
  Filled 2013-12-31: qty 1

## 2013-12-31 MED ORDER — NIFEDIPINE ER 30 MG PO TB24: 30.0000 mg | ORAL_TABLET | Freq: Two times a day (BID) | ORAL | Status: DC

## 2013-12-31 NOTE — Progress Notes (Signed)
Pt discharged per wheelchair with s/o at side. Teach back completed with patient stating an understanding. Patient will follow up with care provider as needed and this Tuesday. Prescription explained to patient. And patient states an understanding of need to fill prescription today.

## 2013-12-31 NOTE — Progress Notes (Signed)
Patient ID: Charlestine NightLori E Holley, female   DOB: [birth year], 23 y.o.   MRN: [id number] Hospital Day: 4  S: Preterm labor symptoms: pelvic pressure and UC's.  O: Blood pressure 109/63, pulse 93, temperature 98.5 F (36.9 C), temperature source Oral, resp. rate 20, height 5\' 2"  (1.575 m), weight 163 lb (73.936 kg), last menstrual period 05/17/2013, SpO2 99.00%, unknown if currently breastfeeding.   WUJ:WJXBJYNWFHT:Baseline: 150 bpm Toco: Occasional UC's GNF:AOZHYQMVSVE:Dilation: 2 Effacement (%): 70 Cervical Position: Posterior Station: -2 Presentation: Vertex Exam by:: thall Rnc  A/P- 23 y.o. admitted with:  PTL.  Stable.  Continue po Procardia.  Anticipate discharge home tomorrow on po Procardia.  Present on Admission:  . Preterm labor in second trimester without delivery  Pregnancy Complications: preterm labor   Preterm labor management: pelvic rest advised, discontinue work, preterm labor education provided and return to office weekly for follow-up Dating:  6679w4d PNL Needed:  none FWB:  good PTL:  Stable ROD: spontaneous vaginal

## 2013-12-31 NOTE — Discharge Summary (Signed)
Physician Discharge Summary  Patient ID: Victoria NightLori E Holley MRN: [id number] DOB/AGE: 13-Oct-1990 23 y.o.  Admit date: 12/28/2013 Discharge date: 12/31/2013  Admission Diagnoses:  PTL  Discharge Diagnoses:  Same Active Problems:   Preterm labor in second trimester without delivery   Rh negative state in antepartum period   Discharged Condition: good  Hospital Course: Admitted with UC's and backache.  Responded well to therapy.    Consults: MFM  Significant Diagnostic Studies: labs: fFN  Treatments: IV hydration and bedrest.  Discharge Exam: Blood pressure 117/58, pulse 114, temperature 98.7 F (37.1 C), temperature source Oral, resp. rate 18, height 5\' 2"  (1.575 m), weight 163 lb (73.936 kg), last menstrual period 05/17/2013, SpO2 99.00%, unknown if currently breastfeeding. General appearance: alert and no distress GI: normal findings: soft, non-tender Extremities: extremities normal, atraumatic, no cyanosis or edema  Disposition: 01-Home or Self Care  Discharge Orders   Future Appointments Provider Department Dept Phone   01/02/2014 10:15 AM Brock Badharles A Kalif Kattner, MD [facility] [phone]   Future Orders Complete By Expires   Discharge diet:  No restrictions  As directed    Discharge instructions  As directed    Do not have sex or do anything that might make you have an orgasm  As directed    Notify physician for a general feeling that "something is not right"  As directed    Notify physician for increase or change in vaginal discharge  As directed    Notify physician for intestinal cramps, with or without diarrhea, sometimes described as "gas pain"  As directed    Notify physician for leaking of fluid  As directed    Notify physician for low, dull backache, unrelieved by heat or Tylenol  As directed    Notify physician for menstrual like cramps  As directed    Notify physician for pelvic pressure  As directed    Notify physician for uterine contractions.  These may be  painless and feel like the uterus is tightening or the baby is  "balling up"  As directed    Notify physician for vaginal bleeding  As directed    PRETERM LABOR:  Includes any of the follwing symptoms that occur between 20 - [redacted] weeks gestation.  If these symptoms are not stopped, preterm labor can result in preterm delivery, placing your baby at risk  As directed        Medication List    TAKE these medications       NIFEdipine 30 MG 24 hr tablet  Commonly known as:  PROCARDIA-XL/ADALAT CC  Take 1 tablet (30 mg total) by mouth 2 (two) times daily.      ASK your doctor about these medications       multivitamin-prenatal 27-0.8 MG Tabs tablet  Take 1 tablet by mouth daily at 12 noon.           Follow-up Information   Follow up with Sireen Halk A, MD In 1 week.   Specialty:  Obstetrics and Gynecology   Contact information:   [address] [place] [address] [phone]       Signed: Brock BadCharles A Lio Wehrly 12/31/2013, 10:25 AM

## 2014-01-02 ENCOUNTER — Ambulatory Visit (INDEPENDENT_AMBULATORY_CARE_PROVIDER_SITE_OTHER): Payer: Medicaid Other | Admitting: Obstetrics

## 2014-01-02 VITALS — BP 118/74 | Temp 96.9°F | Wt 161.0 lb

## 2014-01-02 DIAGNOSIS — Z3480 Encounter for supervision of other normal pregnancy, unspecified trimester: Secondary | ICD-10-CM

## 2014-01-02 LAB — POCT URINALYSIS DIPSTICK
Bilirubin, UA: NEGATIVE
Blood, UA: NEGATIVE
Glucose, UA: NEGATIVE
Ketones, UA: NEGATIVE
Leukocytes, UA: NEGATIVE
Nitrite, UA: NEGATIVE
Protein, UA: NEGATIVE
Spec Grav, UA: 1.015
Urobilinogen, UA: NEGATIVE
pH, UA: 7

## 2014-01-02 NOTE — Progress Notes (Signed)
Pulse- 115 Patient states she is having irregular contractions. Patient states she is had an episode when she about passed out.

## 2014-01-04 ENCOUNTER — Encounter: Payer: Self-pay | Admitting: Obstetrics

## 2014-01-09 ENCOUNTER — Ambulatory Visit (INDEPENDENT_AMBULATORY_CARE_PROVIDER_SITE_OTHER): Payer: Medicaid Other | Admitting: Obstetrics

## 2014-01-09 VITALS — BP 110/70 | HR 82 | Temp 96.9°F | Wt 163.0 lb

## 2014-01-09 DIAGNOSIS — O24419 Gestational diabetes mellitus in pregnancy, unspecified control: Secondary | ICD-10-CM

## 2014-01-09 DIAGNOSIS — O0990 Supervision of high risk pregnancy, unspecified, unspecified trimester: Secondary | ICD-10-CM

## 2014-01-09 DIAGNOSIS — M545 Low back pain, unspecified: Secondary | ICD-10-CM

## 2014-01-09 DIAGNOSIS — Z3480 Encounter for supervision of other normal pregnancy, unspecified trimester: Secondary | ICD-10-CM

## 2014-01-09 DIAGNOSIS — O99810 Abnormal glucose complicating pregnancy: Secondary | ICD-10-CM

## 2014-01-09 LAB — POCT URINALYSIS DIPSTICK
Bilirubin, UA: NEGATIVE
Blood, UA: NEGATIVE
Glucose, UA: NEGATIVE
Ketones, UA: NEGATIVE
Leukocytes, UA: NEGATIVE
Nitrite, UA: NEGATIVE
Protein, UA: NEGATIVE
Spec Grav, UA: 1.01
Urobilinogen, UA: NEGATIVE
pH, UA: 7

## 2014-01-09 MED ORDER — ACCU-CHEK MULTICLIX LANCETS MISC: Status: DC

## 2014-01-09 MED ORDER — GLUCOSE BLOOD VI STRP: ORAL_STRIP | Status: DC

## 2014-01-09 MED ORDER — TRAMADOL HCL 50 MG PO TABS: 50.0000 mg | ORAL_TABLET | Freq: Four times a day (QID) | ORAL | Status: DC | PRN

## 2014-01-09 MED ORDER — ACCU-CHEK AVIVA DEVI: Status: DC

## 2014-01-10 ENCOUNTER — Encounter: Payer: Self-pay | Admitting: Obstetrics

## 2014-01-10 DIAGNOSIS — M545 Low back pain, unspecified: Secondary | ICD-10-CM | POA: Insufficient documentation

## 2014-01-10 DIAGNOSIS — O24419 Gestational diabetes mellitus in pregnancy, unspecified control: Secondary | ICD-10-CM | POA: Insufficient documentation

## 2014-01-10 NOTE — Progress Notes (Signed)
Subjective:    Victoria Humphrey is a 23 y.o. female being seen today for her obstetrical visit. She is at 1624w0d gestation. Patient reports backache, no bleeding, no leaking and occasional contractions. Fetal movement: normal.   Past Medical History  Diagnosis Date  . Migraine headache     Past Surgical History  Procedure Laterality Date  . Dilation and curettage of uterus      Current outpatient prescriptions:NIFEdipine (PROCARDIA-XL/ADALAT CC) 30 MG 24 hr tablet, Take 1 tablet (30 mg total) by mouth 2 (two) times daily., Disp: 60 tablet, Rfl: 2;  Prenatal Vit-Fe Fumarate-FA (MULTIVITAMIN-PRENATAL) 27-0.8 MG TABS tablet, Take 1 tablet by mouth daily at 12 noon., Disp: , Rfl: ;  Blood Glucose Monitoring Suppl (ACCU-CHEK AVIVA) device, Use as instructed, Disp: 1 each, Rfl: 0 glucose blood (ACCU-CHEK AVIVA) test strip, Use as instructed, Disp: 100 each, Rfl: 12;  Lancets (ACCU-CHEK MULTICLIX) lancets, Use as instructed, Disp: 100 each, Rfl: 12;  traMADol (ULTRAM) 50 MG tablet, Take 1 tablet (50 mg total) by mouth every 6 (six) hours as needed., Disp: 40 tablet, Rfl: 2 No Known Allergies  History  Substance Use Topics  . Smoking status: Never Smoker   . Smokeless tobacco: Never Used  . Alcohol Use: No    Family History  Problem Relation Age of Onset  . Anesthesia problems Neg Hx   . Diabetes Father   . Birth defects Brother     spina bifida  . Diabetes Paternal Aunt   . Diabetes Paternal Uncle   . Cancer Paternal Grandmother     breast and thyroid, stomach  . Diabetes Paternal Grandmother      Review of Systems Constitutional: negative for anorexia Gastrointestinal: negative for abdominal pain Genitourinary:negative for vaginal discharge Musculoskeletal:negative for back pain Behavioral/Psych: negative for depression and tobacco use   Objective:    BP 110/70  Pulse 82  Temp(Src) 96.9 F (36.1 C)  Wt 163 lb (73.936 kg)  LMP 05/17/2013 FHT:  150 BPM  Uterine Size: size  equals dates  Presentation: unsure     Assessment:    Pregnancy @ 5724w0d weeks    Backache and UC's    Plan:    Tramadol Rx for backache   labs reviewed, problem list updated Consent signed. GBS sent TDAP offered  Rhogam given for RH negative Pediatrician: discussed. Infant feeding: plans to breastfeed. Maternity leave: discussed. Cigarette smoking: never smoked. Orders Placed This Encounter  Procedures  . POCT urinalysis dipstick   Meds ordered this encounter  Medications  . Blood Glucose Monitoring Suppl (ACCU-CHEK AVIVA) device    Sig: Use as instructed    Dispense:  1 each    Refill:  0  . Lancets (ACCU-CHEK MULTICLIX) lancets    Sig: Use as instructed    Dispense:  100 each    Refill:  12  . glucose blood (ACCU-CHEK AVIVA) test strip    Sig: Use as instructed    Dispense:  100 each    Refill:  12  . traMADol (ULTRAM) 50 MG tablet    Sig: Take 1 tablet (50 mg total) by mouth every 6 (six) hours as needed.    Dispense:  40 tablet    Refill:  2   Follow up in 2 Weeks.

## 2014-01-14 ENCOUNTER — Inpatient Hospital Stay (HOSPITAL_COMMUNITY)
Admission: AD | Admit: 2014-01-14 | Discharge: 2014-01-14 | Disposition: A | Discharge status: Home / Self Care | Payer: Medicaid Other | Source: Ambulatory Visit | Attending: Obstetrics | Admitting: Obstetrics

## 2014-01-14 ENCOUNTER — Encounter (HOSPITAL_COMMUNITY): Payer: Self-pay

## 2014-01-14 DIAGNOSIS — O4700 False labor before 37 completed weeks of gestation, unspecified trimester: Secondary | ICD-10-CM | POA: Insufficient documentation

## 2014-01-14 DIAGNOSIS — O479 False labor, unspecified: Secondary | ICD-10-CM

## 2014-01-14 HISTORY — DX: Personal history of other infectious and parasitic diseases: Z86.19

## 2014-01-14 LAB — URINALYSIS, ROUTINE W REFLEX MICROSCOPIC
Bilirubin Urine: NEGATIVE
Glucose, UA: NEGATIVE mg/dL
Hgb urine dipstick: NEGATIVE
Ketones, ur: NEGATIVE mg/dL
Leukocytes, UA: NEGATIVE
Nitrite: NEGATIVE
Protein, ur: NEGATIVE mg/dL
Specific Gravity, Urine: 1.02 (ref 1.005–1.030)
Urobilinogen, UA: 0.2 mg/dL (ref 0.0–1.0)
pH: 6.5 (ref 5.0–8.0)

## 2014-01-14 MED ORDER — OXYCODONE-ACETAMINOPHEN 5-325 MG PO TABS
2.0000 | ORAL_TABLET | Freq: Once | ORAL | Status: DC
  Filled 2014-01-14: qty 2

## 2014-01-14 MED ORDER — LACTATED RINGERS IV BOLUS (SEPSIS)
1000.0000 mL | Freq: Once | INTRAVENOUS | Status: AC
  Administered 2014-01-14: 1000 mL via INTRAVENOUS

## 2014-01-14 NOTE — MAU Note (Signed)
Pt G4 P2 at 34.4wks, hx PTL, rec'd magnesium and Betamethasone inj.  Pt having contractions every 2-94min since Wednesday, contractions are stronger today.  Passed mucous plug today.  4/22 SVE 2-3cm.

## 2014-01-14 NOTE — MAU Provider Note (Signed)
History     CSN: [id number]  Arrival date and time: 01/14/14 1911   First Provider Initiated Contact with Patient 01/14/14 1949      Chief Complaint  Patient presents with  . Contractions   HPI  Victoria Humphrey is a 22yo [id number] at 1242w4d.   She presents with c/o continued contractions that are getting stronger. Her ctx are 2 m, she passed mucoid discharge today. She has had mag to stop contractions and betamethasone. She stopped her Procardia a week ago, made her dizzy. She declines Terb also.  Baby was not as active, but very active since arrived at hospital.  Last IC yesterday.   OB History   Grav Para Term Preterm Abortions TAB SAB Ect Mult Living   4 2 1 1 1  1   2       Past Medical History  Diagnosis Date  . Migraine headache   . Preterm labor   . Hx of chlamydia infection 03/2013    treated    Past Surgical History  Procedure Laterality Date  . Dilation and curettage of uterus      Family History  Problem Relation Age of Onset  . Anesthesia problems Neg Hx   . Diabetes Father   . Birth defects Brother     spina bifida  . Diabetes Paternal Aunt   . Diabetes Paternal Uncle   . Cancer Paternal Grandmother     breast and thyroid, stomach  . Diabetes Paternal Grandmother     History  Substance Use Topics  . Smoking status: Never Smoker   . Smokeless tobacco: Never Used  . Alcohol Use: No    Allergies: No Known Allergies  Prescriptions prior to admission  Medication Sig Dispense Refill  . NIFEdipine (PROCARDIA-XL/ADALAT CC) 30 MG 24 hr tablet Take 1 tablet (30 mg total) by mouth 2 (two) times daily.  60 tablet  2  . Prenatal Vit-Fe Fumarate-FA (MULTIVITAMIN-PRENATAL) 27-0.8 MG TABS tablet Take 1 tablet by mouth daily at 12 noon.      . Blood Glucose Monitoring Suppl (ACCU-CHEK AVIVA) device Use as instructed  1 each  0  . glucose blood (ACCU-CHEK AVIVA) test strip Use as instructed  100 each  12  . Lancets (ACCU-CHEK MULTICLIX) lancets Use as instructed   100 each  12  . traMADol (ULTRAM) 50 MG tablet Take 1 tablet (50 mg total) by mouth every 6 (six) hours as needed.  40 tablet  2    Review of Systems  Constitutional: Negative for fever and chills.  Gastrointestinal: Negative for nausea, vomiting, diarrhea and constipation.  Genitourinary: Negative for dysuria, urgency and frequency.       Mucoid discharge, contractions   Physical Exam   Blood pressure 109/64, pulse 97, temperature 97.7 F (36.5 C), temperature source Oral, resp. rate 18, height 5\' 2"  (1.575 m), weight 164 lb 12.8 oz (74.753 kg), last menstrual period 05/17/2013, unknown if currently breastfeeding.  Physical Exam  Constitutional: She is oriented to person, place, and time. She appears well-developed and well-nourished.  GI: Soft. There is no tenderness. There is no rebound.  Genitourinary:  Cervical exam by RN- 2 cm, 40 % ballotable- unchanged from previous exam  Musculoskeletal: Normal range of motion.  Neurological: She is alert and oriented to person, place, and time.  Skin: Skin is warm and dry.  Psychiatric: She has a normal mood and affect. Her behavior is normal.    MAU Course  Procedures 2015- consulted with Dr Clearance CootsHarper,  1 liter IV fluids, may discharge home. Keep next PNV 4/28, pelvic rest. MDM Care to Zorita PangH Hogan, CNM @ 9pm  2124: D/W Dr. Clearance CootsHarper, ctx q 1.5-2 mins and patient more uncomfortable now. Cervical exam is unchanged. He states to offer her 2 percocet, and then she may be dc home. Asked about obs on ante overnight, and he did not recommend that at this time.   Assessment and Plan   1. Braxton Hicks contractions    Pelvic rest Preterm labor precautions Fetal kick counts Return to MAU as needed  Follow-up Information   Follow up with HARPER,CHARLES A, MD. (As scheduled)    Specialty:  Obstetrics and Gynecology   Contact information:   [address] [place] [address] [phone]        Rodell PernaKathy M Harris 01/14/2014,  7:55 PM

## 2014-01-14 NOTE — Discharge Instructions (Signed)
Pelvic Rest Pelvic rest is sometimes recommended for women when:   The placenta is partially or completely covering the opening of the cervix (placenta previa).  There is bleeding between the uterine wall and the amniotic sac in the first trimester (subchorionic hemorrhage).  The cervix begins to open without labor starting (incompetent cervix, cervical insufficiency).  The labor is too early (preterm labor). HOME CARE INSTRUCTIONS  Do not have sexual intercourse, stimulation, or an orgasm.  Do not use tampons, douche, or put anything in the vagina.  Do not lift anything over 10 pounds (4.5 kg).  Avoid strenuous activity or straining your pelvic muscles. SEEK MEDICAL CARE IF:  You have any vaginal bleeding during pregnancy. Treat this as a potential emergency.  You have cramping pain felt low in the stomach (stronger than menstrual cramps).  You notice vaginal discharge (watery, mucus, or bloody).  You have a low, dull backache.  There are regular contractions or uterine tightening. SEEK IMMEDIATE MEDICAL CARE IF: You have vaginal bleeding and have placenta previa.  Document Released: 01/02/2011 Document Revised: 11/30/2011 Document Reviewed: 01/02/2011 [facility] Patient Information 2014 [place], [place].  Braxton Hicks Contractions Pregnancy is commonly associated with contractions of the uterus throughout the pregnancy. Towards the end of pregnancy (32 to 34 weeks), these contractions [facility](Braxton Willa RoughHicks) can develop more often and may become more forceful. This is not true labor because these contractions do not result in opening (dilatation) and thinning of the cervix. They are sometimes difficult to tell apart from true labor because these contractions can be forceful and people have different pain tolerances. You should not feel embarrassed if you go to the hospital with false labor. Sometimes, the only way to tell if you are in true labor is for your caregiver to follow the  changes in the cervix. How to tell the difference between true and false labor:  False labor.  The contractions of false labor are usually shorter, irregular and not as hard as those of true labor.  They are often felt in the front of the lower abdomen and in the groin.  They may leave with walking around or changing positions while lying down.  They get weaker and are shorter lasting as time goes on.  These contractions are usually irregular.  They do not usually become progressively stronger, regular and closer together as with true labor.  True labor.  Contractions in true labor last 30 to 70 seconds, become very regular, usually become more intense, and increase in frequency.  They do not go away with walking.  The discomfort is usually felt in the top of the uterus and spreads to the lower abdomen and low back.  True labor can be determined by your caregiver with an exam. This will show that the cervix is dilating and getting thinner. If there are no prenatal problems or other health problems associated with the pregnancy, it is completely safe to be sent home with false labor and await the onset of true labor. HOME CARE INSTRUCTIONS   Keep up with your usual exercises and instructions.  Take medications as directed.  Keep your regular prenatal appointment.  Eat and drink lightly if you think you are going into labor.  If BH contractions are making you uncomfortable:  Change your activity position from lying down or resting to walking/walking to resting.  Sit and rest in a tub of warm water.  Drink 2 to 3 glasses of water. Dehydration may cause B-H contractions.  Do slow and  deep breathing several times an hour. SEEK IMMEDIATE MEDICAL CARE IF:   Your contractions continue to become stronger, more regular, and closer together.  You have a gushing, burst or leaking of fluid from the vagina.  An oral temperature above 102 F (38.9 C) develops.  You have  passage of blood-tinged mucus.  You develop vaginal bleeding.  You develop continuous belly (abdominal) pain.  You have low back pain that you never had before.  You feel the baby's head pushing down causing pelvic pressure.  The baby is not moving as much as it used to. Document Released: 09/07/2005 Document Revised: 11/30/2011 Document Reviewed: 06/19/2013 [facility] Patient Information 2014 [place], [place].

## 2014-01-15 ENCOUNTER — Telehealth: Payer: Self-pay | Admitting: *Deleted

## 2014-01-15 NOTE — Telephone Encounter (Signed)
Patient called office- states she is still having contractions and pain. Patient has Procardia, but is not using because she says it makes her dizzy. Patient has small children to care for. Patient  Advised to get rest- she does not want to go back to the hospital at this time- per Dr Clearance CootsHarper, comfort measures given- patient advised if her symptoms should increase she does need to go back to MAU.

## 2014-01-23 ENCOUNTER — Encounter: Payer: Self-pay | Admitting: Obstetrics

## 2014-01-23 ENCOUNTER — Ambulatory Visit (INDEPENDENT_AMBULATORY_CARE_PROVIDER_SITE_OTHER): Payer: Medicaid Other | Admitting: Obstetrics

## 2014-01-23 VITALS — BP 107/71 | HR 87 | Temp 97.7°F | Wt 168.0 lb

## 2014-01-23 DIAGNOSIS — Z3480 Encounter for supervision of other normal pregnancy, unspecified trimester: Secondary | ICD-10-CM

## 2014-01-23 LAB — POCT URINALYSIS DIPSTICK
Bilirubin, UA: NEGATIVE
Blood, UA: NEGATIVE
Glucose, UA: NEGATIVE
Ketones, UA: NEGATIVE
Nitrite, UA: NEGATIVE
Protein, UA: 100
Spec Grav, UA: <=1.005
Urobilinogen, UA: NEGATIVE
pH, UA: 8

## 2014-01-23 NOTE — Progress Notes (Signed)
Subjective:    Victoria Humphrey is a 23 y.o. female being seen today for her obstetrical visit. She is at 1642w6d gestation. Patient reports contractions since yesterday. Fetal movement: normal.  Problem List Items Addressed This Visit   None    Visit Diagnoses   Supervision of other normal pregnancy    -  Primary    Relevant Orders       POCT urinalysis dipstick       Strep B DNA probe      Patient Active Problem List   Diagnosis Date Noted  . Gestational diabetes 01/10/2014  . Low backache 01/10/2014  . Rh negative state in antepartum period 12/29/2013  . Preterm labor in second trimester without delivery 12/28/2013  . Backache 11/14/2013  . Choroid plexus cyst of fetus on prenatal ultrasound 10/11/2013  . Pain 08/02/2013  . HA (headache) 08/02/2013  . Unspecified high-risk pregnancy 07/10/2013  . Personal history of pre-term labor 07/10/2013  . Hx of preterm delivery, currently pregnant 07/10/2013  . Nausea and vomiting in pregnancy prior to [redacted] weeks gestation 07/10/2013  . Postmaturity pregnancy, 40-[redacted] weeks gestation 11/03/2011  . Pregnancy headache, antepartum 05/12/2011   Objective:    BP 107/71  Pulse 87  Temp(Src) 97.7 F (36.5 C)  Wt 168 lb (76.204 kg)  LMP 05/17/2013 FHT:  150 BPM  Uterine Size: size equals dates  Presentation: cephalic     Assessment:    Pregnancy @ 3442w6d weeks   Plan:     labs reviewed, problem list updated Consent signed. GBS sent TDAP offered  Rhogam given for RH negative Pediatrician: discussed. Infant feeding: plans to breastfeed. Maternity leave: discussed. Cigarette smoking: never smoked. Orders Placed This Encounter  Procedures  . Strep B DNA probe  . POCT urinalysis dipstick   No orders of the defined types were placed in this encounter.   Follow up in 1 Week.

## 2014-01-25 LAB — STREP B DNA PROBE: GBSP: POSITIVE

## 2014-01-27 ENCOUNTER — Inpatient Hospital Stay (HOSPITAL_COMMUNITY)
Admission: AD | Admit: 2014-01-27 | Discharge: 2014-01-27 | Disposition: A | Discharge status: Home / Self Care | Payer: Medicaid Other | Source: Ambulatory Visit | Attending: Obstetrics | Admitting: Obstetrics

## 2014-01-27 ENCOUNTER — Encounter (HOSPITAL_COMMUNITY): Payer: Self-pay

## 2014-01-27 DIAGNOSIS — IMO0002 Reserved for concepts with insufficient information to code with codable children: Secondary | ICD-10-CM

## 2014-01-27 DIAGNOSIS — O9989 Other specified diseases and conditions complicating pregnancy, childbirth and the puerperium: Principal | ICD-10-CM

## 2014-01-27 DIAGNOSIS — Z6791 Unspecified blood type, Rh negative: Secondary | ICD-10-CM

## 2014-01-27 DIAGNOSIS — O350XX0 Maternal care for (suspected) central nervous system malformation in fetus, not applicable or unspecified: Secondary | ICD-10-CM

## 2014-01-27 DIAGNOSIS — O99891 Other specified diseases and conditions complicating pregnancy: Secondary | ICD-10-CM | POA: Insufficient documentation

## 2014-01-27 DIAGNOSIS — O26899 Other specified pregnancy related conditions, unspecified trimester: Secondary | ICD-10-CM

## 2014-01-27 DIAGNOSIS — O4700 False labor before 37 completed weeks of gestation, unspecified trimester: Secondary | ICD-10-CM | POA: Insufficient documentation

## 2014-01-27 DIAGNOSIS — R109 Unspecified abdominal pain: Secondary | ICD-10-CM | POA: Insufficient documentation

## 2014-01-27 LAB — AMNISURE RUPTURE OF MEMBRANE (ROM) NOT AT ARMC: Amnisure ROM: NEGATIVE

## 2014-01-27 MED ORDER — OXYCODONE-ACETAMINOPHEN 5-325 MG PO TABS
2.0000 | ORAL_TABLET | Freq: Once | ORAL | Status: DC
  Filled 2014-01-27: qty 2

## 2014-01-27 NOTE — Discharge Instructions (Signed)
Braxton Hicks Contractions Pregnancy is commonly associated with contractions of the uterus throughout the pregnancy. Towards the end of pregnancy (32 to 34 weeks), these contractions (Braxton Hicks) can develop more often and may become more forceful. This is not true labor because these contractions do not result in opening (dilatation) and thinning of the cervix. They are sometimes difficult to tell apart from true labor because these contractions can be forceful and people have different pain tolerances. You should not feel embarrassed if you go to the hospital with false labor. Sometimes, the only way to tell if you are in true labor is for your caregiver to follow the changes in the cervix. How to tell the difference between true and false labor:  False labor.  The contractions of false labor are usually shorter, irregular and not as hard as those of true labor.  They are often felt in the front of the lower abdomen and in the groin.  They may leave with walking around or changing positions while lying down.  They get weaker and are shorter lasting as time goes on.  These contractions are usually irregular.  They do not usually become progressively stronger, regular and closer together as with true labor.  True labor.  Contractions in true labor last 30 to 70 seconds, become very regular, usually become more intense, and increase in frequency.  They do not go away with walking.  The discomfort is usually felt in the top of the uterus and spreads to the lower abdomen and low back.  True labor can be determined by your caregiver with an exam. This will show that the cervix is dilating and getting thinner. If there are no prenatal problems or other health problems associated with the pregnancy, it is completely safe to be sent home with false labor and await the onset of true labor. HOME CARE INSTRUCTIONS   Keep up with your usual exercises and instructions.  Take medications as  directed.  Keep your regular prenatal appointment.  Eat and drink lightly if you think you are going into labor.  If BH contractions are making you uncomfortable:  Change your activity position from lying down or resting to walking/walking to resting.  Sit and rest in a tub of warm water.  Drink 2 to 3 glasses of water. Dehydration may cause B-H contractions.  Do slow and deep breathing several times an hour. SEEK IMMEDIATE MEDICAL CARE IF:   Your contractions continue to become stronger, more regular, and closer together.  You have a gushing, burst or leaking of fluid from the vagina.  An oral temperature above 102 F (38.9 C) develops.  You have passage of blood-tinged mucus.  You develop vaginal bleeding.  You develop continuous belly (abdominal) pain.  You have low back pain that you never had before.  You feel the baby's head pushing down causing pelvic pressure.  The baby is not moving as much as it used to. Document Released: 09/07/2005 Document Revised: 11/30/2011 Document Reviewed: 06/19/2013 [employer] Patient Information 2014 [employer].  Fetal Movement Counts Patient Name: __________________________________________________ Patient Due Date: ____________________ Performing a fetal movement count is highly recommended in high-risk pregnancies, but it is good for every pregnant woman to do. Your caregiver may ask you to start counting fetal movements at 28 weeks of the pregnancy. Fetal movements often increase:  After eating a full meal.  After physical activity.  After eating or drinking something sweet or cold.  At rest. Pay attention to when you feel   the baby is most active. This will help you notice a pattern of your baby's sleep and wake cycles and what factors contribute to an increase in fetal movement. It is important to perform a fetal movement count at the same time each day when your baby is normally most active.  HOW TO COUNT FETAL  MOVEMENTS 1. Find a quiet and comfortable area to sit or lie down on your left side. Lying on your left side provides the best blood and oxygen circulation to your baby. 2. Write down the day and time on a sheet of paper or in a journal. 3. Start counting kicks, flutters, swishes, rolls, or jabs in a 2 hour period. You should feel at least 10 movements within 2 hours. 4. If you do not feel 10 movements in 2 hours, wait 2 3 hours and count again. Look for a change in the pattern or not enough counts in 2 hours. SEEK MEDICAL CARE IF:  You feel less than 10 counts in 2 hours, tried twice.  There is no movement in over an hour.  The pattern is changing or taking longer each day to reach 10 counts in 2 hours.  You feel the baby is not moving as he or she usually does. Date: ____________ Movements: ____________ Start time: ____________ Finish time: ____________  Date: ____________ Movements: ____________ Start time: ____________ Finish time: ____________ Date: ____________ Movements: ____________ Start time: ____________ Finish time: ____________ Date: ____________ Movements: ____________ Start time: ____________ Finish time: ____________ Date: ____________ Movements: ____________ Start time: ____________ Finish time: ____________ Date: ____________ Movements: ____________ Start time: ____________ Finish time: ____________ Date: ____________ Movements: ____________ Start time: ____________ Finish time: ____________ Date: ____________ Movements: ____________ Start time: ____________ Finish time: ____________  Date: ____________ Movements: ____________ Start time: ____________ Finish time: ____________ Date: ____________ Movements: ____________ Start time: ____________ Finish time: ____________ Date: ____________ Movements: ____________ Start time: ____________ Finish time: ____________ Date: ____________ Movements: ____________ Start time: ____________ Finish time: ____________ Date: ____________  Movements: ____________ Start time: ____________ Finish time: ____________ Date: ____________ Movements: ____________ Start time: ____________ Finish time: ____________ Date: ____________ Movements: ____________ Start time: ____________ Finish time: ____________  Date: ____________ Movements: ____________ Start time: ____________ Finish time: ____________ Date: ____________ Movements: ____________ Start time: ____________ Finish time: ____________ Date: ____________ Movements: ____________ Start time: ____________ Finish time: ____________ Date: ____________ Movements: ____________ Start time: ____________ Finish time: ____________ Date: ____________ Movements: ____________ Start time: ____________ Finish time: ____________ Date: ____________ Movements: ____________ Start time: ____________ Finish time: ____________ Date: ____________ Movements: ____________ Start time: ____________ Finish time: ____________  Date: ____________ Movements: ____________ Start time: ____________ Finish time: ____________ Date: ____________ Movements: ____________ Start time: ____________ Finish time: ____________ Date: ____________ Movements: ____________ Start time: ____________ Finish time: ____________ Date: ____________ Movements: ____________ Start time: ____________ Finish time: ____________ Date: ____________ Movements: ____________ Start time: ____________ Finish time: ____________ Date: ____________ Movements: ____________ Start time: ____________ Finish time: ____________ Date: ____________ Movements: ____________ Start time: ____________ Finish time: ____________  Date: ____________ Movements: ____________ Start time: ____________ Finish time: ____________ Date: ____________ Movements: ____________ Start time: ____________ Finish time: ____________ Date: ____________ Movements: ____________ Start time: ____________ Finish time: ____________ Date: ____________ Movements: ____________ Start time:  ____________ Finish time: ____________ Date: ____________ Movements: ____________ Start time: ____________ Finish time: ____________ Date: ____________ Movements: ____________ Start time: ____________ Finish time: ____________ Date: ____________ Movements: ____________ Start time: ____________ Finish time: ____________  Date: ____________ Movements: ____________ Start time: ____________ Finish time: ____________ Date: ____________ Movements: ____________ Start   time: ____________ Finish time: ____________ Date: ____________ Movements: ____________ Start time: ____________ Finish time: ____________ Date: ____________ Movements: ____________ Start time: ____________ Finish time: ____________ Date: ____________ Movements: ____________ Start time: ____________ Finish time: ____________ Date: ____________ Movements: ____________ Start time: ____________ Finish time: ____________ Date: ____________ Movements: ____________ Start time: ____________ Finish time: ____________  Date: ____________ Movements: ____________ Start time: ____________ Finish time: ____________ Date: ____________ Movements: ____________ Start time: ____________ Finish time: ____________ Date: ____________ Movements: ____________ Start time: ____________ Finish time: ____________ Date: ____________ Movements: ____________ Start time: ____________ Finish time: ____________ Date: ____________ Movements: ____________ Start time: ____________ Finish time: ____________ Date: ____________ Movements: ____________ Start time: ____________ Finish time: ____________ Date: ____________ Movements: ____________ Start time: ____________ Finish time: ____________  Date: ____________ Movements: ____________ Start time: ____________ Finish time: ____________ Date: ____________ Movements: ____________ Start time: ____________ Finish time: ____________ Date: ____________ Movements: ____________ Start time: ____________ Finish time: ____________ Date:  ____________ Movements: ____________ Start time: ____________ Finish time: ____________ Date: ____________ Movements: ____________ Start time: ____________ Finish time: ____________ Date: ____________ Movements: ____________ Start time: ____________ Finish time: ____________ Document Released: 10/07/2006 Document Revised: 08/24/2012 Document Reviewed: 07/04/2012 [employer] Patient Information 2014 [employer].  

## 2014-01-27 NOTE — MAU Note (Signed)
Pt states mucusy discharge for past three days but is thinner now. Last night had small trickle and small gush of fluid run down her leg. All last night and this am, has been having pain. Has had ctx's since 32 weeks, rcvd BMZ. Was 3cm/50% at MD office Tuesday.

## 2014-01-27 NOTE — OB Triage Note (Addendum)
Pt reports leaking of fluid last PM and white discharge today with ctx. Fern test negative and amnisure negative. SVE 3/70/-1 with no leaking of fluid noted. Ctx irregular with minimal cervical change since last SVE. FHR reactive and reviewed by Theone MurdochM. Early, RNC. MD prescribed 2 percocet prior to d/c. Pt refused pain meds.

## 2014-01-30 ENCOUNTER — Encounter: Payer: Medicaid Other | Admitting: Obstetrics

## 2014-01-30 ENCOUNTER — Ambulatory Visit (INDEPENDENT_AMBULATORY_CARE_PROVIDER_SITE_OTHER): Payer: Medicaid Other | Admitting: Obstetrics

## 2014-01-30 VITALS — BP 108/65 | HR 103 | Temp 98.2°F | Wt 170.0 lb

## 2014-01-30 DIAGNOSIS — Z3480 Encounter for supervision of other normal pregnancy, unspecified trimester: Secondary | ICD-10-CM

## 2014-01-30 LAB — POCT URINALYSIS DIPSTICK
Blood, UA: NEGATIVE
Glucose, UA: NEGATIVE
Ketones, UA: NEGATIVE
Nitrite, UA: NEGATIVE
Protein, UA: NEGATIVE
Spec Grav, UA: <=1.005
pH, UA: 7

## 2014-01-31 ENCOUNTER — Encounter: Payer: Self-pay | Admitting: Obstetrics

## 2014-01-31 NOTE — Progress Notes (Signed)
Subjective:    Victoria Humphrey is a 23 y.o. female being seen today for her obstetrical visit. She is at 541w0d gestation. Patient reports contractions since several days off an on. Fetal movement: normal.  Problem List Items Addressed This Visit   None    Visit Diagnoses   Supervision of other normal pregnancy    -  Primary    Relevant Orders       POCT urinalysis dipstick (Completed)      Patient Active Problem List   Diagnosis Date Noted  . Gestational diabetes 01/10/2014  . Low backache 01/10/2014  . Rh negative state in antepartum period 12/29/2013  . Preterm labor in second trimester without delivery 12/28/2013  . Backache 11/14/2013  . Choroid plexus cyst of fetus on prenatal ultrasound 10/11/2013  . Pain 08/02/2013  . HA (headache) 08/02/2013  . Unspecified high-risk pregnancy 07/10/2013  . Personal history of pre-term labor 07/10/2013  . Hx of preterm delivery, currently pregnant 07/10/2013  . Nausea and vomiting in pregnancy prior to [redacted] weeks gestation 07/10/2013  . Postmaturity pregnancy, 40-[redacted] weeks gestation 11/03/2011  . Pregnancy headache, antepartum 05/12/2011    Objective:    BP 108/65  Pulse 103  Temp(Src) 98.2 F (36.8 C)  Wt 170 lb (77.111 kg)  LMP 05/17/2013 FHT: 150 BPM  Uterine Size: size equals dates  Presentations: cephalic  Pelvic Exam:              Dilation: 2cm       Effacement: 50%             Station:  -1    Consistency: soft            Position: middle     Assessment:    Pregnancy @ 3041w0d weeks   Plan:   Plans for delivery: Vaginal anticipated; labs reviewed; problem list updated Counseling: Consent signed. Infant feeding: plans to breastfeed. Cigarette smoking: never smoked. L&D discussion: symptoms of labor, discussed when to call, discussed what number to call, anesthetic/analgesic options reviewed and delivering clinician:  plans Physician. Postpartum supports and preparation: circumcision discussed and contraception plans  discussed.  Follow up in 1 Week.

## 2014-02-01 ENCOUNTER — Encounter (HOSPITAL_COMMUNITY): Payer: Self-pay | Admitting: *Deleted

## 2014-02-01 ENCOUNTER — Inpatient Hospital Stay (HOSPITAL_COMMUNITY)
Admission: AD | Admit: 2014-02-01 | Discharge: 2014-02-02 | Disposition: A | Discharge status: Home / Self Care | Payer: Medicaid Other | Source: Ambulatory Visit | Attending: Obstetrics | Admitting: Obstetrics

## 2014-02-01 DIAGNOSIS — O479 False labor, unspecified: Secondary | ICD-10-CM | POA: Insufficient documentation

## 2014-02-01 LAB — URINALYSIS, ROUTINE W REFLEX MICROSCOPIC
Bilirubin Urine: NEGATIVE
Glucose, UA: NEGATIVE mg/dL
Hgb urine dipstick: NEGATIVE
Ketones, ur: NEGATIVE mg/dL
Nitrite: NEGATIVE
Protein, ur: NEGATIVE mg/dL
Specific Gravity, Urine: 1.015 (ref 1.005–1.030)
Urobilinogen, UA: 1 mg/dL (ref 0.0–1.0)
pH: 6 (ref 5.0–8.0)

## 2014-02-01 LAB — URINE MICROSCOPIC-ADD ON

## 2014-02-01 NOTE — MAU Note (Signed)
Pt states she started having contractions this morning.

## 2014-02-02 MED ORDER — ZOLPIDEM TARTRATE 5 MG PO TABS
5.0000 mg | ORAL_TABLET | Freq: Once | ORAL | Status: AC
  Administered 2014-02-02: 5 mg via ORAL
  Filled 2014-02-02: qty 1

## 2014-02-02 NOTE — Discharge Instructions (Signed)

## 2014-02-05 ENCOUNTER — Inpatient Hospital Stay (HOSPITAL_COMMUNITY)
Admission: AD | Admit: 2014-02-05 | Discharge: 2014-02-05 | Disposition: A | Discharge status: Home / Self Care | Payer: Medicaid Other | Source: Ambulatory Visit | Attending: Obstetrics & Gynecology | Admitting: Obstetrics & Gynecology

## 2014-02-05 ENCOUNTER — Encounter (HOSPITAL_COMMUNITY): Payer: Self-pay | Admitting: *Deleted

## 2014-02-05 DIAGNOSIS — O360990 Maternal care for other rhesus isoimmunization, unspecified trimester, not applicable or unspecified: Secondary | ICD-10-CM

## 2014-02-05 DIAGNOSIS — IMO0002 Reserved for concepts with insufficient information to code with codable children: Secondary | ICD-10-CM

## 2014-02-05 DIAGNOSIS — N909 Noninflammatory disorder of vulva and perineum, unspecified: Secondary | ICD-10-CM | POA: Insufficient documentation

## 2014-02-05 DIAGNOSIS — Z6791 Unspecified blood type, Rh negative: Secondary | ICD-10-CM

## 2014-02-05 DIAGNOSIS — O479 False labor, unspecified: Secondary | ICD-10-CM | POA: Insufficient documentation

## 2014-02-05 DIAGNOSIS — L739 Follicular disorder, unspecified: Secondary | ICD-10-CM

## 2014-02-05 DIAGNOSIS — O26899 Other specified pregnancy related conditions, unspecified trimester: Secondary | ICD-10-CM

## 2014-02-05 DIAGNOSIS — L678 Other hair color and hair shaft abnormalities: Secondary | ICD-10-CM | POA: Insufficient documentation

## 2014-02-05 DIAGNOSIS — L738 Other specified follicular disorders: Secondary | ICD-10-CM | POA: Insufficient documentation

## 2014-02-05 DIAGNOSIS — O3500X0 Maternal care for (suspected) central nervous system malformation or damage in fetus, unspecified, not applicable or unspecified: Secondary | ICD-10-CM | POA: Insufficient documentation

## 2014-02-05 DIAGNOSIS — O350XX0 Maternal care for (suspected) central nervous system malformation in fetus, not applicable or unspecified: Secondary | ICD-10-CM

## 2014-02-05 NOTE — Discharge Instructions (Signed)
Third Trimester of Pregnancy  The third trimester is from week 29 through week 42, months 7 through 9. The third trimester is a time when the fetus is growing rapidly. At the end of the ninth month, the fetus is about 20 inches in length and weighs 6 10 pounds.   BODY CHANGES  Your body goes through many changes during pregnancy. The changes vary from woman to woman.    Your weight will continue to increase. You can expect to gain 25 35 pounds (11 16 kg) by the end of the pregnancy.   You may begin to get stretch marks on your hips, abdomen, and breasts.   You may urinate more often because the fetus is moving lower into your pelvis and pressing on your bladder.   You may develop or continue to have heartburn as a result of your pregnancy.   You may develop constipation because certain hormones are causing the muscles that push waste through your intestines to slow down.   You may develop hemorrhoids or swollen, bulging veins (varicose veins).   You may have pelvic pain because of the weight gain and pregnancy hormones relaxing your joints between the bones in your pelvis. Back aches may result from over exertion of the muscles supporting your posture.   Your breasts will continue to grow and be tender. A yellow discharge may leak from your breasts called colostrum.   Your belly button may stick out.   You may feel short of breath because of your expanding uterus.   You may notice the fetus "dropping," or moving lower in your abdomen.   You may have a bloody mucus discharge. This usually occurs a few days to a week before labor begins.   Your cervix becomes thin and soft (effaced) near your due date.  WHAT TO EXPECT AT YOUR PRENATAL EXAMS   You will have prenatal exams every 2 weeks until week 36. Then, you will have weekly prenatal exams. During a routine prenatal visit:   You will be weighed to make sure you and the fetus are growing normally.   Your blood pressure is taken.   Your abdomen will be  measured to track your baby's growth.   The fetal heartbeat will be listened to.   Any test results from the previous visit will be discussed.   You may have a cervical check near your due date to see if you have effaced.  At around 36 weeks, your caregiver will check your cervix. At the same time, your caregiver will also perform a test on the secretions of the vaginal tissue. This test is to determine if a type of bacteria, Group B streptococcus, is present. Your caregiver will explain this further.  Your caregiver may ask you:   What your birth plan is.   How you are feeling.   If you are feeling the baby move.   If you have had any abnormal symptoms, such as leaking fluid, bleeding, severe headaches, or abdominal cramping.   If you have any questions.  Other tests or screenings that may be performed during your third trimester include:   Blood tests that check for low iron levels (anemia).   Fetal testing to check the health, activity level, and growth of the fetus. Testing is done if you have certain medical conditions or if there are problems during the pregnancy.  FALSE LABOR  You may feel small, irregular contractions that eventually go away. These are called Braxton Hicks contractions, or   false labor. Contractions may last for hours, days, or even weeks before true labor sets in. If contractions come at regular intervals, intensify, or become painful, it is best to be seen by your caregiver.   SIGNS OF LABOR    Menstrual-like cramps.   Contractions that are 5 minutes apart or less.   Contractions that start on the top of the uterus and spread down to the lower abdomen and back.   A sense of increased pelvic pressure or back pain.   A watery or bloody mucus discharge that comes from the vagina.  If you have any of these signs before the 37th week of pregnancy, call your caregiver right away. You need to go to the hospital to get checked immediately.  HOME CARE INSTRUCTIONS    Avoid all  smoking, herbs, alcohol, and unprescribed drugs. These chemicals affect the formation and growth of the baby.   Follow your caregiver's instructions regarding medicine use. There are medicines that are either safe or unsafe to take during pregnancy.   Exercise only as directed by your caregiver. Experiencing uterine cramps is a good sign to stop exercising.   Continue to eat regular, healthy meals.   Wear a good support bra for breast tenderness.   Do not use hot tubs, steam rooms, or saunas.   Wear your seat belt at all times when driving.   Avoid raw meat, uncooked cheese, cat litter boxes, and soil used by cats. These carry germs that can cause birth defects in the baby.   Take your prenatal vitamins.   Try taking a stool softener (if your caregiver approves) if you develop constipation. Eat more high-fiber foods, such as fresh vegetables or fruit and whole grains. Drink plenty of fluids to keep your urine clear or pale yellow.   Take warm sitz baths to soothe any pain or discomfort caused by hemorrhoids. Use hemorrhoid cream if your caregiver approves.   If you develop varicose veins, wear support hose. Elevate your feet for 15 minutes, 3 4 times a day. Limit salt in your diet.   Avoid heavy lifting, wear low heal shoes, and practice good posture.   Rest a lot with your legs elevated if you have leg cramps or low back pain.   Visit your dentist if you have not gone during your pregnancy. Use a soft toothbrush to brush your teeth and be gentle when you floss.   A sexual relationship may be continued unless your caregiver directs you otherwise.   Do not travel far distances unless it is absolutely necessary and only with the approval of your caregiver.   Take prenatal classes to understand, practice, and ask questions about the labor and delivery.   Make a trial run to the hospital.   Pack your hospital bag.   Prepare the baby's nursery.   Continue to go to all your prenatal visits as directed  by your caregiver.  SEEK MEDICAL CARE IF:   You are unsure if you are in labor or if your water has broken.   You have dizziness.   You have mild pelvic cramps, pelvic pressure, or nagging pain in your abdominal area.   You have persistent nausea, vomiting, or diarrhea.   You have a bad smelling vaginal discharge.   You have pain with urination.  SEEK IMMEDIATE MEDICAL CARE IF:    You have a fever.   You are leaking fluid from your vagina.   You have spotting or bleeding from your vagina.     You have severe abdominal cramping or pain.   You have rapid weight loss or gain.   You have shortness of breath with chest pain.   You notice sudden or extreme swelling of your face, hands, ankles, feet, or legs.   You have not felt your baby move in over an hour.   You have severe headaches that do not go away with medicine.   You have vision changes.  Document Released: 09/01/2001 Document Revised: 05/10/2013 Document Reviewed: 11/08/2012  [employer] Patient Information 2014 [employer].

## 2014-02-05 NOTE — MAU Note (Signed)
Patient states she has a small lump on the inside of the left labia. Boyfriend though it was an ingrown hair and pulled on it and green pus came out. States she she has vaginal bleeding earlier today that was bright red, no bleeding now. Some contractions since arriving at MAU. Reports good fetal movement.

## 2014-02-05 NOTE — MAU Provider Note (Signed)
History     CSN: [id number]  Arrival date and time: 02/05/14 1758   First Provider Initiated Contact with Patient 02/05/14 2012      Chief Complaint  Patient presents with  . Mass   HPI  Victoria Humphrey is a 23 y.o. [id number] at 7537w5d who presents today with a "bump" on her labia. She states that she noticed it yesterday, and her boyfriend "popped" it today. She was concerned that she may need an antibiotic. She denies any other vaginal discharge. She states that she has had contractions for "months", and that she was 4cm the last time she was here. She states that when she wiped today she had some spotting. She last had intercourse yesterday. She has an appointment in the morning at the office She reports normal fetal movement.   Past Medical History  Diagnosis Date  . Migraine headache   . Preterm labor   . Hx of chlamydia infection 03/2013    treated    Past Surgical History  Procedure Laterality Date  . Dilation and curettage of uterus      Family History  Problem Relation Age of Onset  . Anesthesia problems Neg Hx   . Diabetes Father   . Birth defects Brother     spina bifida  . Diabetes Paternal Aunt   . Diabetes Paternal Uncle   . Cancer Paternal Grandmother     breast and thyroid, stomach  . Diabetes Paternal Grandmother     History  Substance Use Topics  . Smoking status: Never Smoker   . Smokeless tobacco: Never Used  . Alcohol Use: No    Allergies: No Known Allergies  Prescriptions prior to admission  Medication Sig Dispense Refill  . Blood Glucose Monitoring Suppl (ACCU-CHEK AVIVA) device Use as instructed  1 each  0  . glucose blood (ACCU-CHEK AVIVA) test strip Use as instructed  100 each  12  . Lancets (ACCU-CHEK MULTICLIX) lancets Use as instructed  100 each  12    ROS Physical Exam   Blood pressure 106/58, pulse 101, temperature 98.5 F (36.9 C), temperature source Oral, resp. rate 18, weight 75.206 kg (165 lb 12.8 oz), last menstrual period  05/17/2013, SpO2 99.00%, unknown if currently breastfeeding.  Physical Exam  Nursing note and vitals reviewed. Constitutional: She is oriented to person, place, and time. She appears well-developed and well-nourished. No distress.  Cardiovascular: Normal rate.   Respiratory: Effort normal.  GI: Soft. There is no tenderness.  Genitourinary:  0.5cm hard nodule on left labia minor. Slightly tender, no exudate seen, unable to express any exudate at this time.   Neurological: She is alert and oriented to person, place, and time.  Skin: Skin is warm and dry.  Psychiatric: She has a normal mood and affect.   FHT: 140, mderate with 15x15 accles, no decels Toco: q 2-5 min ctx, mild to palpation.  Cervix: unchanged 4/80/-2/no bloody show.  MAU Course  Procedures    Assessment and Plan   1. Rh negative state in antepartum period   2. Choroid plexus cyst of fetus on prenatal ultrasound   3. Braxton Hicks contractions   4. Folliculitis    3rd trimester danger signs Labor precautions reviewed Fetal kick counts Return to MAU as needed Warm soaks/sitz baths to affected area  Follow-up Information   Follow up with HARPER,CHARLES A, MD. (As scheduled)    Specialty:  Obstetrics and Gynecology   Contact information:   [address]  [place] [id number] [phone]        Tawnya CrookHeather Donovan Hogan 02/05/2014, 8:30 PM

## 2014-02-06 ENCOUNTER — Ambulatory Visit (INDEPENDENT_AMBULATORY_CARE_PROVIDER_SITE_OTHER): Payer: Medicaid Other | Admitting: Obstetrics

## 2014-02-06 ENCOUNTER — Encounter: Payer: Self-pay | Admitting: Obstetrics

## 2014-02-06 VITALS — BP 123/81 | HR 96 | Temp 96.8°F | Wt 166.0 lb

## 2014-02-06 DIAGNOSIS — Z3480 Encounter for supervision of other normal pregnancy, unspecified trimester: Secondary | ICD-10-CM

## 2014-02-06 LAB — POCT URINALYSIS DIPSTICK
Bilirubin, UA: NEGATIVE
Blood, UA: NEGATIVE
Glucose, UA: NEGATIVE
Ketones, UA: NEGATIVE
Nitrite, UA: NEGATIVE
Protein, UA: NEGATIVE
Spec Grav, UA: 1.01
Urobilinogen, UA: NEGATIVE
pH, UA: 6

## 2014-02-06 NOTE — Progress Notes (Signed)
Subjective:    Victoria Humphrey is a 23 y.o. female being seen today for her obstetrical visit. She is at 8122w6d gestation. Patient reports occasional contractions. Fetal movement: normal.  Problem List Items Addressed This Visit   None    Visit Diagnoses   Supervision of other normal pregnancy    -  Primary    Relevant Orders       POCT urinalysis dipstick (Completed)      Patient Active Problem List   Diagnosis Date Noted  . Gestational diabetes 01/10/2014  . Low backache 01/10/2014  . Rh negative state in antepartum period 12/29/2013  . Preterm labor in second trimester without delivery 12/28/2013  . Backache 11/14/2013  . Choroid plexus cyst of fetus on prenatal ultrasound 10/11/2013  . Pain 08/02/2013  . HA (headache) 08/02/2013  . Unspecified high-risk pregnancy 07/10/2013  . Personal history of pre-term labor 07/10/2013  . Hx of preterm delivery, currently pregnant 07/10/2013  . Nausea and vomiting in pregnancy prior to [redacted] weeks gestation 07/10/2013  . Postmaturity pregnancy, 40-[redacted] weeks gestation 11/03/2011  . Pregnancy headache, antepartum 05/12/2011    Objective:    BP 123/81  Pulse 96  Temp(Src) 96.8 F (36 C)  Wt 166 lb (75.297 kg)  LMP 05/17/2013 FHT: 150 BPM  Uterine Size: size equals dates  Presentations: cephalic  Pelvic Exam:  Deferred    Assessment:    Pregnancy @ 3222w6d weeks   Plan:   Plans for delivery: Vaginal anticipated; labs reviewed; problem list updated Counseling: Consent signed. Infant feeding: plans to breastfeed. Cigarette smoking: never smoked. L&D discussion: symptoms of labor, discussed when to call, discussed what number to call, anesthetic/analgesic options reviewed and delivering clinician:  plans Physician. Postpartum supports and preparation: circumcision discussed and contraception plans discussed.  Follow up in 1 Week.

## 2014-02-13 ENCOUNTER — Ambulatory Visit (INDEPENDENT_AMBULATORY_CARE_PROVIDER_SITE_OTHER): Payer: Medicaid Other | Admitting: Obstetrics

## 2014-02-13 ENCOUNTER — Encounter: Payer: Self-pay | Admitting: Obstetrics

## 2014-02-13 VITALS — BP 112/75 | HR 102 | Temp 97.4°F | Wt 173.0 lb

## 2014-02-13 DIAGNOSIS — Z3480 Encounter for supervision of other normal pregnancy, unspecified trimester: Secondary | ICD-10-CM

## 2014-02-13 NOTE — Progress Notes (Signed)
Subjective:    Victoria Humphrey is a 23 y.o. female being seen today for her obstetrical visit. She is at [redacted]w[redacted]d gestation. Patient reports no complaints. Fetal movement: normal.  Problem List Items Addressed This Visit   None     Patient Active Problem List   Diagnosis Date Noted  . Gestational diabetes 01/10/2014  . Low backache 01/10/2014  . Rh negative state in antepartum period 12/29/2013  . Preterm labor in second trimester without delivery 12/28/2013  . Backache 11/14/2013  . Choroid plexus cyst of fetus on prenatal ultrasound 10/11/2013  . Pain 08/02/2013  . HA (headache) 08/02/2013  . Unspecified high-risk pregnancy 07/10/2013  . Personal history of pre-term labor 07/10/2013  . Hx of preterm delivery, currently pregnant 07/10/2013  . Nausea and vomiting in pregnancy prior to [redacted] weeks gestation 07/10/2013  . Postmaturity pregnancy, 40-[redacted] weeks gestation 11/03/2011  . Pregnancy headache, antepartum 05/12/2011    Objective:    BP 112/75  Pulse 102  Temp(Src) 97.4 F (36.3 C)  Wt 173 lb (78.472 kg)  LMP 05/17/2013 FHT: 150 BPM  Uterine Size: size greater than dates  Presentations: cephalic  Pelvic Exam:              Dilation: 3cm       Effacement: 75%             Station:  -3    Consistency: soft            Position: posterior     Assessment:    Pregnancy @ [redacted]w[redacted]d weeks   Plan:   Plans for delivery: Vaginal anticipated; labs reviewed; problem list updated Counseling: Consent signed. Infant feeding: plans to breastfeed. Cigarette smoking: never smoked. L&D discussion: symptoms of labor, discussed when to call, discussed what number to call, anesthetic/analgesic options reviewed and delivering clinician:  plans Physician. Postpartum supports and preparation: circumcision discussed and contraception plans discussed.   IOL 02-18-14.

## 2014-02-14 ENCOUNTER — Encounter (HOSPITAL_COMMUNITY): Payer: Self-pay

## 2014-02-14 ENCOUNTER — Inpatient Hospital Stay (HOSPITAL_COMMUNITY)
Admission: RE | Admit: 2014-02-14 | Discharge: 2014-02-16 | DRG: 775 | Disposition: A | Discharge status: Home / Self Care | Payer: Medicaid Other | Source: Ambulatory Visit | Attending: Obstetrics & Gynecology | Admitting: Obstetrics & Gynecology

## 2014-02-14 VITALS — BP 103/70 | HR 69 | Temp 97.9°F | Resp 20 | Ht 62.0 in | Wt 173.0 lb

## 2014-02-14 DIAGNOSIS — O99892 Other specified diseases and conditions complicating childbirth: Secondary | ICD-10-CM | POA: Diagnosis present

## 2014-02-14 DIAGNOSIS — IMO0002 Reserved for concepts with insufficient information to code with codable children: Secondary | ICD-10-CM

## 2014-02-14 DIAGNOSIS — Z22330 Carrier of Group B streptococcus: Secondary | ICD-10-CM

## 2014-02-14 DIAGNOSIS — O9989 Other specified diseases and conditions complicating pregnancy, childbirth and the puerperium: Secondary | ICD-10-CM

## 2014-02-14 DIAGNOSIS — Z6791 Unspecified blood type, Rh negative: Secondary | ICD-10-CM

## 2014-02-14 DIAGNOSIS — Z8659 Personal history of other mental and behavioral disorders: Secondary | ICD-10-CM

## 2014-02-14 DIAGNOSIS — O0940 Supervision of pregnancy with grand multiparity, unspecified trimester: Secondary | ICD-10-CM

## 2014-02-14 DIAGNOSIS — Z3490 Encounter for supervision of normal pregnancy, unspecified, unspecified trimester: Secondary | ICD-10-CM

## 2014-02-14 DIAGNOSIS — Z833 Family history of diabetes mellitus: Secondary | ICD-10-CM

## 2014-02-14 DIAGNOSIS — O350XX0 Maternal care for (suspected) central nervous system malformation in fetus, not applicable or unspecified: Secondary | ICD-10-CM

## 2014-02-14 DIAGNOSIS — O26899 Other specified pregnancy related conditions, unspecified trimester: Secondary | ICD-10-CM

## 2014-02-14 DIAGNOSIS — Z8759 Personal history of other complications of pregnancy, childbirth and the puerperium: Secondary | ICD-10-CM

## 2014-02-14 LAB — POCT URINALYSIS DIPSTICK
Bilirubin, UA: NEGATIVE
Blood, UA: NEGATIVE
Glucose, UA: 50
Ketones, UA: NEGATIVE
Nitrite, UA: NEGATIVE
Protein, UA: NEGATIVE
Spec Grav, UA: <=1.005
Urobilinogen, UA: NEGATIVE
pH, UA: 7

## 2014-02-14 LAB — CBC
HCT: 33.8 % — ABNORMAL LOW (ref 36.0–46.0)
Hemoglobin: 10.8 g/dL — ABNORMAL LOW (ref 12.0–15.0)
MCH: 23.6 pg — ABNORMAL LOW (ref 26.0–34.0)
MCHC: 32 g/dL (ref 30.0–36.0)
MCV: 74 fL — ABNORMAL LOW (ref 78.0–100.0)
Platelets: 213 10*3/uL (ref 150–400)
RBC: 4.57 MIL/uL (ref 3.87–5.11)
RDW: 16.3 % — ABNORMAL HIGH (ref 11.5–15.5)
WBC: 11.6 10*3/uL — ABNORMAL HIGH (ref 4.0–10.5)

## 2014-02-14 LAB — GLUCOSE, CAPILLARY: Glucose-Capillary: 115 mg/dL — ABNORMAL HIGH (ref 70–99)

## 2014-02-14 LAB — RPR

## 2014-02-14 MED ORDER — FERROUS SULFATE 325 (65 FE) MG PO TABS
325.0000 mg | ORAL_TABLET | Freq: Two times a day (BID) | ORAL | Status: DC
  Administered 2014-02-15 – 2014-02-16 (×3): 325 mg via ORAL
  Filled 2014-02-14 (×3): qty 1

## 2014-02-14 MED ORDER — ONDANSETRON HCL 4 MG/2ML IJ SOLN: 4.0000 mg | INTRAMUSCULAR | Status: DC | PRN

## 2014-02-14 MED ORDER — NALBUPHINE HCL 10 MG/ML IJ SOLN: 10.0000 mg | Freq: Four times a day (QID) | INTRAMUSCULAR | Status: DC | PRN

## 2014-02-14 MED ORDER — OXYCODONE-ACETAMINOPHEN 5-325 MG PO TABS
1.0000 | ORAL_TABLET | ORAL | Status: DC | PRN
  Administered 2014-02-14 – 2014-02-16 (×2): 1 via ORAL
  Filled 2014-02-14 (×2): qty 1

## 2014-02-14 MED ORDER — FENTANYL CITRATE 0.05 MG/ML IJ SOLN
100.0000 ug | INTRAMUSCULAR | Status: DC | PRN
  Administered 2014-02-14: 100 ug via INTRAVENOUS

## 2014-02-14 MED ORDER — ZOLPIDEM TARTRATE 5 MG PO TABS: 5.0000 mg | ORAL_TABLET | Freq: Every evening | ORAL | Status: DC | PRN

## 2014-02-14 MED ORDER — FLEET ENEMA 7-19 GM/118ML RE ENEM: 1.0000 | ENEMA | RECTAL | Status: DC | PRN

## 2014-02-14 MED ORDER — OXYTOCIN 40 UNITS IN LACTATED RINGERS INFUSION - SIMPLE MED
62.5000 mL/h | INTRAVENOUS | Status: DC
Start: 2014-02-14 — End: 2014-02-14

## 2014-02-14 MED ORDER — TETANUS-DIPHTH-ACELL PERTUSSIS 5-2.5-18.5 LF-MCG/0.5 IM SUSP: 0.5000 mL | Freq: Once | INTRAMUSCULAR | Status: DC

## 2014-02-14 MED ORDER — LACTATED RINGERS IV SOLN: 500.0000 mL | INTRAVENOUS | Status: DC | PRN

## 2014-02-14 MED ORDER — BUTORPHANOL TARTRATE 1 MG/ML IJ SOLN
2.0000 mg | INTRAMUSCULAR | Status: DC | PRN
  Administered 2014-02-14 (×2): 2 mg via INTRAVENOUS
  Filled 2014-02-14 (×2): qty 2

## 2014-02-14 MED ORDER — OXYCODONE-ACETAMINOPHEN 5-325 MG PO TABS: 1.0000 | ORAL_TABLET | ORAL | Status: DC | PRN

## 2014-02-14 MED ORDER — ONDANSETRON HCL 4 MG/2ML IJ SOLN: 4.0000 mg | Freq: Four times a day (QID) | INTRAMUSCULAR | Status: DC | PRN

## 2014-02-14 MED ORDER — DIPHENHYDRAMINE HCL 25 MG PO CAPS: 25.0000 mg | ORAL_CAPSULE | Freq: Four times a day (QID) | ORAL | Status: DC | PRN

## 2014-02-14 MED ORDER — WITCH HAZEL-GLYCERIN EX PADS: 1.0000 "application " | MEDICATED_PAD | CUTANEOUS | Status: DC | PRN

## 2014-02-14 MED ORDER — NALOXONE HCL 0.4 MG/ML IJ SOLN
INTRAMUSCULAR | Status: AC
  Filled 2014-02-14: qty 1

## 2014-02-14 MED ORDER — TERBUTALINE SULFATE 1 MG/ML IJ SOLN: 0.2500 mg | Freq: Once | INTRAMUSCULAR | Status: DC | PRN

## 2014-02-14 MED ORDER — PRENATAL MULTIVITAMIN CH
1.0000 | ORAL_TABLET | Freq: Every day | ORAL | Status: DC
  Administered 2014-02-15 – 2014-02-16 (×2): 1 via ORAL
  Filled 2014-02-14 (×2): qty 1

## 2014-02-14 MED ORDER — PENICILLIN G POTASSIUM 5000000 UNITS IJ SOLR
2.5000 10*6.[IU] | INTRAMUSCULAR | Status: DC
  Filled 2014-02-14 (×2): qty 5

## 2014-02-14 MED ORDER — IBUPROFEN 600 MG PO TABS
600.0000 mg | ORAL_TABLET | Freq: Four times a day (QID) | ORAL | Status: DC
  Administered 2014-02-15 – 2014-02-16 (×7): 600 mg via ORAL
  Filled 2014-02-14 (×7): qty 1

## 2014-02-14 MED ORDER — PROMETHAZINE HCL 25 MG/ML IJ SOLN: 25.0000 mg | Freq: Four times a day (QID) | INTRAMUSCULAR | Status: DC | PRN

## 2014-02-14 MED ORDER — BENZOCAINE-MENTHOL 20-0.5 % EX AERO
1.0000 "application " | INHALATION_SPRAY | CUTANEOUS | Status: DC | PRN
  Administered 2014-02-14: 1 via TOPICAL
  Filled 2014-02-14: qty 56

## 2014-02-14 MED ORDER — PENICILLIN G POTASSIUM 5000000 UNITS IJ SOLR
2.5000 10*6.[IU] | INTRAVENOUS | Status: DC
  Administered 2014-02-14 (×2): 2.5 10*6.[IU] via INTRAVENOUS
  Filled 2014-02-14 (×5): qty 2.5

## 2014-02-14 MED ORDER — ONDANSETRON HCL 4 MG PO TABS: 4.0000 mg | ORAL_TABLET | ORAL | Status: DC | PRN

## 2014-02-14 MED ORDER — IBUPROFEN 600 MG PO TABS
600.0000 mg | ORAL_TABLET | Freq: Four times a day (QID) | ORAL | Status: DC | PRN
  Administered 2014-02-14: 600 mg via ORAL
  Filled 2014-02-14: qty 1

## 2014-02-14 MED ORDER — SENNOSIDES-DOCUSATE SODIUM 8.6-50 MG PO TABS
2.0000 | ORAL_TABLET | ORAL | Status: DC
  Administered 2014-02-15: 2 via ORAL
  Filled 2014-02-14 (×2): qty 2

## 2014-02-14 MED ORDER — ACETAMINOPHEN 325 MG PO TABS: 650.0000 mg | ORAL_TABLET | ORAL | Status: DC | PRN

## 2014-02-14 MED ORDER — NALBUPHINE HCL 10 MG/ML IJ SOLN: 10.0000 mg | INTRAMUSCULAR | Status: DC | PRN

## 2014-02-14 MED ORDER — DIBUCAINE 1 % RE OINT: 1.0000 "application " | TOPICAL_OINTMENT | RECTAL | Status: DC | PRN

## 2014-02-14 MED ORDER — FENTANYL CITRATE 0.05 MG/ML IJ SOLN
INTRAMUSCULAR | Status: AC
  Administered 2014-02-14: 100 ug via INTRAVENOUS
  Filled 2014-02-14: qty 2

## 2014-02-14 MED ORDER — OXYTOCIN BOLUS FROM INFUSION: 500.0000 mL | INTRAVENOUS | Status: DC

## 2014-02-14 MED ORDER — LIDOCAINE HCL (PF) 1 % IJ SOLN
30.0000 mL | INTRAMUSCULAR | Status: DC | PRN
  Filled 2014-02-14: qty 30

## 2014-02-14 MED ORDER — MEASLES, MUMPS & RUBELLA VAC ~~LOC~~ INJ: 0.5000 mL | INJECTION | Freq: Once | SUBCUTANEOUS | Status: DC

## 2014-02-14 MED ORDER — OXYTOCIN 40 UNITS IN LACTATED RINGERS INFUSION - SIMPLE MED
1.0000 m[IU]/min | INTRAVENOUS | Status: DC
Start: 2014-02-14 — End: 2014-02-14
  Administered 2014-02-14: 2 m[IU]/min via INTRAVENOUS
  Filled 2014-02-14: qty 1000

## 2014-02-14 MED ORDER — MAGNESIUM HYDROXIDE 400 MG/5ML PO SUSP: 30.0000 mL | ORAL | Status: DC | PRN

## 2014-02-14 MED ORDER — PENICILLIN G POTASSIUM 5000000 UNITS IJ SOLR
5.0000 10*6.[IU] | Freq: Once | INTRAMUSCULAR | Status: DC
  Filled 2014-02-14: qty 5

## 2014-02-14 MED ORDER — CITRIC ACID-SODIUM CITRATE 334-500 MG/5ML PO SOLN: 30.0000 mL | ORAL | Status: DC | PRN

## 2014-02-14 MED ORDER — LANOLIN HYDROUS EX OINT: TOPICAL_OINTMENT | CUTANEOUS | Status: DC | PRN

## 2014-02-14 MED ORDER — DEXTROSE 5 % IV SOLN
5.0000 10*6.[IU] | Freq: Once | INTRAVENOUS | Status: AC
  Administered 2014-02-14: 5 10*6.[IU] via INTRAVENOUS
  Filled 2014-02-14: qty 5

## 2014-02-14 MED ORDER — LACTATED RINGERS IV SOLN
INTRAVENOUS | Status: DC
  Administered 2014-02-14 (×2): via INTRAVENOUS

## 2014-02-14 NOTE — H&P (Signed)
Victoria Humphrey is a 23 y.o. female presenting for Elective IOL. Maternal Medical History:  Reason for admission: 23 yo G4  P1112.  Presents for elective IOL for multiparity, favorable cervix, with severe back and pelvic pain, at [redacted] weeks gestation.   Contractions: Frequency: irregular.   Perceived severity is mild.    Fetal activity: Perceived fetal activity is normal.   Last perceived fetal movement was within the past hour.    Prenatal complications: no prenatal complications   OB History   Grav Para Term Preterm Abortions TAB SAB Ect Mult Living   4 2 1 1 1  1   2      Past Medical History  Diagnosis Date  . Migraine headache   . Preterm labor   . Hx of chlamydia infection 03/2013    treated   Past Surgical History  Procedure Laterality Date  . Dilation and curettage of uterus     Family History: family history includes Birth defects in her brother; Cancer in her paternal grandmother; Diabetes in her father, paternal aunt, paternal grandmother, and paternal uncle. There is no history of Anesthesia problems. Social History:  reports that she has never smoked. She has never used smokeless tobacco. She reports that she does not drink alcohol or use illicit drugs.   Prenatal Transfer Tool  Maternal Diabetes: Yes:  Diabetes Type:  Diet controlled Genetic Screening: Normal Maternal Ultrasounds/Referrals: Normal Fetal Ultrasounds or other Referrals:  Referred to Materal Fetal Medicine  Maternal Substance Abuse:  No Significant Maternal Medications:  None Significant Maternal Lab Results:  Lab values include: Group B Strep positive Other Comments:  None  ROS  Dilation: 4 Effacement (%): 80 Station: -2 Exam by:: Hollerman, RNC Blood pressure 118/77, pulse 99, temperature 97.5 F (36.4 C), temperature source Oral, resp. rate 18, height 5\' 2"  (1.575 m), weight 173 lb (78.472 kg), last menstrual period 05/17/2013, unknown if currently breastfeeding. Exam Physical Exam   Prenatal labs: ABO, Rh: O/NEG/-- (10/20 1517) Antibody: NEG (10/20 1517) Rubella: 2.57 (10/20 1517) RPR: NON REAC (03/16 1007)  HBsAg: NEGATIVE (10/20 1517)  HIV: NON REACTIVE (03/16 1007)  GBS: POSITIVE (05/05 1306)   Assessment/Plan: 39 weeks, favorable cervix, multiparity with severe back and pelvic pain.  Elective IOL.  Pitocin , low dose per protocol.  GBS prophylaxis in labor.   [name] 02/14/2014, 9:19 AM

## 2014-02-14 NOTE — Progress Notes (Signed)
Victoria Humphrey is a 23 y.o. [id number] at [redacted]w[redacted]d by LMP admitted for induction of labor due to Elective at term.  Subjective:   Objective: BP 117/79  Pulse 96  Temp(Src) 97.5 F (36.4 C) (Oral)  Resp 18  Ht 5\' 2"  (1.575 m)  Wt 173 lb (78.472 kg)  BMI 31.63 kg/m2  LMP 05/17/2013      FHT:  FHR: 150 bpm, variability: moderate,  accelerations:  Present,  decelerations:  Absent UC:   Irregular, mild SVE:   Dilation: 4 Effacement (%): 80 Station: -2 Exam by:: Hollerman, RNC  Labs: Lab Results  Component Value Date   WBC 11.6* 02/14/2014   HGB 10.8* 02/14/2014   HCT 33.8* 02/14/2014   MCV 74.0* 02/14/2014   PLT 213 02/14/2014    Assessment / Plan: Induction of labor due to term with favorable cervix,  progressing well on pitocin  Labor: Latent phase Preeclampsia:  n/a Fetal Wellbeing:  Category I Pain Control:  Epidural I/D:  n/a Anticipated MOD:  NSVD  Victoria Humphrey 02/14/2014, 9:37 AM

## 2014-02-14 NOTE — Addendum Note (Signed)
Addended by: Odessa Fleming on: 02/14/2014 09:23 AM   Modules accepted: Orders

## 2014-02-14 NOTE — Progress Notes (Signed)
Patient ID: Victoria Humphrey, female   DOB: [birth year], 23 y.o.   MRN: [id number] Victoria Humphrey is a 23 y.o. [id number] at [redacted]w[redacted]d by LMP admitted for induction of labor due to Elective at term.  Subjective: Uncomfortable with UCs  Objective: BP 138/77  Pulse 110  Temp(Src) 98.1 F (36.7 C) (Oral)  Resp 18  Ht 5\' 2"  (1.575 m)  Wt 78.472 kg (173 lb)  BMI 31.63 kg/m2  LMP 05/17/2013      FHT:  FHR: 150 bpm, variability: moderate,  accelerations:  Present,  decelerations:  Absent UC:   Irregular, mild SVE:   Dilation: 5 Effacement (%): 80 Station: -2 Exam by:: Holleman, RNC AROM clear fluid Labs: Lab Results  Component Value Date   WBC 11.6* 02/14/2014   HGB 10.8* 02/14/2014   HCT 33.8* 02/14/2014   MCV 74.0* 02/14/2014   PLT 213 02/14/2014    Assessment / Plan: Induction of labor due to term with favorable cervix,  progressing well on pitocin  Labor: Latent phase Preeclampsia:  n/a Fetal Wellbeing:  Category I Pain Control:  Epidural I/D:  n/a Anticipated MOD:  NSVD  [name] 02/14/2014, 6:31 PM

## 2014-02-15 ENCOUNTER — Encounter (HOSPITAL_COMMUNITY): Payer: Self-pay

## 2014-02-15 MED ORDER — RHO D IMMUNE GLOBULIN 1500 UNIT/2ML IJ SOSY
300.0000 ug | PREFILLED_SYRINGE | Freq: Once | INTRAMUSCULAR | Status: AC
  Administered 2014-02-15: 300 ug via INTRAMUSCULAR
  Filled 2014-02-15: qty 2

## 2014-02-15 NOTE — Progress Notes (Signed)
Post Partum Day 1 Subjective: no complaints  Objective: Blood pressure 97/63, pulse 66, temperature 97.9 F (36.6 C), temperature source Oral, resp. rate 18, height 5\' 2"  (1.575 m), weight 173 lb (78.472 kg), last menstrual period 05/17/2013, unknown if currently breastfeeding.  Physical Exam:  General: alert and no distress Lochia: appropriate Uterine Fundus: firm Incision: healing well DVT Evaluation: No evidence of DVT seen on physical exam.   Recent Labs  02/14/14 0740  HGB 10.8*  HCT 33.8*    Assessment/Plan: Plan for discharge tomorrow   LOS: 1 day   [name] 02/15/2014, 9:12 AM

## 2014-02-15 NOTE — Lactation Note (Signed)
This note was copied from the chart of Victoria Humphrey. Lactation Consultation Note  Patient Name: Victoria Kade Deatrick QIWLN'L Date: 02/15/2014 Reason for consult: Follow-up assessment to observe a latch with this baby, now 22 hours postpartum.  Baby is latched with lips flanged but mom c/o slight nipple tenderness.  Baby sustains latch for 10 minutes but when LC tries to help mom adjust latch, baby slips off and is no longer cuing to feed.  Mom's nipple is symmetrical and everted with no visible trauma.  LC discussed importance of supporting her breast throughout feeding to prevent breast from slipping in baby's mouth which can lead to soreness.  LC also demonstrated cross-cradle and how to achieve asymmetrical latch for deeper areolar grasp.   Maternal Data    Feeding Feeding Type: Breast Fed Length of feed: 10 min  LATCH Score/Interventions Latch: Grasps breast easily, tongue down, lips flanged, rhythmical sucking. Intervention(s): Adjust position  Audible Swallowing: A few with stimulation Intervention(s): Skin to skin  Type of Nipple: Everted at rest and after stimulation  Comfort (Breast/Nipple): Soft / non-tender     Hold (Positioning): Assistance needed to correctly position infant at breast and maintain latch.  LATCH Score: 8  Lactation Tools Discussed/Used   Breast support, signs of proper latch  Consult Status Consult Status: Follow-up Date: 02/16/14 Follow-up type: In-patient    Zara Chess 02/15/2014, 5:05 PM

## 2014-02-15 NOTE — Lactation Note (Signed)
This note was copied from the chart of Victoria Humphrey. Lactation Consultation Note  Patient Name: Victoria Humphrey Date: 02/15/2014 Reason for consult: Initial assessment Per mom baby recently fed at 1030 , presently mom holding baby after bath . Also  Per  Mom supplemented during the night due to being so exhausted. LC discussed importance of having a feeding assessment done by Rn or LC and encouraged  Mom to call on nurses light. Mother informed of post-discharge support and given phone number to the lactation department, including services for phone call assistance; out-patient appointments; and breastfeeding support group. List of other breastfeeding resources in the community given in the handout. Encouraged mother to call for problems or concerns related to breastfeeding.   Maternal Data    Feeding Feeding Type:  (per mom BF at 1030 ) Length of feed: 20 min  LATCH Score/Interventions Latch: Repeated attempts needed to sustain latch, nipple held in mouth throughout feeding, stimulation needed to elicit sucking reflex. Intervention(s): Adjust position;Assist with latch  Audible Swallowing: A few with stimulation Intervention(s): Hand expression;Skin to skin  Type of Nipple: Everted at rest and after stimulation  Comfort (Breast/Nipple): Soft / non-tender     Hold (Positioning): Assistance needed to correctly position infant at breast and maintain latch. Intervention(s): Breastfeeding basics reviewed  LATCH Score: 7  Lactation Tools Discussed/Used WIC Program: Yes   Consult Status Consult Status: Follow-up (encouraged to page for latch check ) Date: 02/15/14 Follow-up type: In-patient    [name] [name] 02/15/2014, 12:19 PM

## 2014-02-15 NOTE — Progress Notes (Signed)
CSW referral received to inquire about MOBs concerns about FOB & his mother taking her baby. MOB said she made that comment because she does not think FOBs mother likes her. During the pregnancy, the PGM allegedly told MOB that she was "going to do whatever is in her power to take the baby." MOB said she is not sure why she said that but thinks she has since had a change of heart. PGM was present at delivery & the women were able to be cordial. MOB states she is not concerned about FOB or his mother threatening to taking the baby from her at this time. She denies any depression or SI history. FOB, Tony Eubanks & pt are still in a relationship. They are doing well, per pt. She has all the necessary supplies she needs for the infant. Pt lives alone with her 2 other children. She is employed at [employer]. Previous CPS involvement in 2014 however the case was closed per pt. CSW does not identify any barriers to discharge at this. CSW available to assist further if needed.      

## 2014-02-15 NOTE — Progress Notes (Signed)
CM / UR chart review completed.  

## 2014-02-16 DIAGNOSIS — Z8759 Personal history of other complications of pregnancy, childbirth and the puerperium: Secondary | ICD-10-CM

## 2014-02-16 DIAGNOSIS — Z8659 Personal history of other mental and behavioral disorders: Secondary | ICD-10-CM

## 2014-02-16 HISTORY — DX: Personal history of other complications of pregnancy, childbirth and the puerperium: Z87.59

## 2014-02-16 HISTORY — DX: Personal history of other mental and behavioral disorders: Z86.59

## 2014-02-16 LAB — RH IG WORKUP (INCLUDES ABO/RH)
ABO/RH(D): O NEG
Antibody Screen: NEGATIVE
Fetal Screen: NEGATIVE
Gestational Age(Wks): 39
Unit division: 0

## 2014-02-16 MED ORDER — IBUPROFEN 600 MG PO TABS: 600.0000 mg | ORAL_TABLET | Freq: Four times a day (QID) | ORAL | Status: DC

## 2014-02-16 MED ORDER — OB COMPLETE PETITE 35-5-1-200 MG PO CAPS: 1.0000 | ORAL_CAPSULE | Freq: Every day | ORAL | Status: DC

## 2014-02-16 NOTE — Discharge Instructions (Signed)

## 2014-02-16 NOTE — Discharge Summary (Signed)
Obstetric Discharge Summary Reason for Admission: onset of labor Prenatal Procedures: none Intrapartum Procedures: spontaneous vaginal delivery Postpartum Procedures: none Complications-Operative and Postpartum: 2nd degree perineal laceration Hemoglobin  Date Value Ref Range Status  02/14/2014 10.8* 12.0 - 15.0 g/dL Final     HCT  Date Value Ref Range Status  02/14/2014 33.8* 36.0 - 46.0 % Final   Patient reports situational PP depression w/ last baby. Monitor in clinic in 1-2 weeks.  Filed Vitals:   02/16/14 0557  BP: 103/70  Pulse: 69  Temp: 97.9 F (36.6 C)  Resp: 20    Physical Exam:  General: alert and cooperative Lochia: appropriate Uterine Fundus: firm Incision: NA DVT Evaluation: No evidence of DVT seen on physical exam.  Discharge Diagnoses: Term Pregnancy-delivered  Discharge Information: Date: 02/16/2014 Activity: unrestricted Diet: routine Medications: PNV and Ibuprofen Condition: stable Instructions: refer to practice specific booklet Discharge to: home  BCM: IUD vs BTL  Newborn Data: Live born female  Birth Weight: 9 lb 2.7 oz (4159 g) APGAR: 7, 9  Home with mother.  Victoria Humphrey Dessa Phi 02/16/2014, 9:01 AM

## 2014-03-01 ENCOUNTER — Telehealth: Payer: Self-pay | Admitting: *Deleted

## 2014-03-01 NOTE — Telephone Encounter (Signed)
Pt called in to office stating that her baby is 72 weeks old and is breastfeeding.  Pt states that baby now has thrush and needs treatment.    Please advise.

## 2014-03-02 NOTE — Telephone Encounter (Signed)
Call Pediatrician.

## 2014-03-08 ENCOUNTER — Ambulatory Visit: Payer: Medicaid Other | Admitting: Obstetrics

## 2014-03-13 ENCOUNTER — Ambulatory Visit (INDEPENDENT_AMBULATORY_CARE_PROVIDER_SITE_OTHER): Payer: Medicaid Other | Admitting: Obstetrics

## 2014-03-13 ENCOUNTER — Encounter: Payer: Self-pay | Admitting: Obstetrics

## 2014-03-13 DIAGNOSIS — O99345 Other mental disorders complicating the puerperium: Secondary | ICD-10-CM | POA: Insufficient documentation

## 2014-03-13 DIAGNOSIS — F53 Postpartum depression: Secondary | ICD-10-CM | POA: Insufficient documentation

## 2014-03-13 MED ORDER — SERTRALINE HCL 50 MG PO TABS: 50.0000 mg | ORAL_TABLET | Freq: Every day | ORAL | Status: DC

## 2014-03-13 NOTE — Telephone Encounter (Signed)
Pt has been seen in office since call. 

## 2014-03-13 NOTE — Progress Notes (Signed)
Subjective:     Victoria Humphrey is a 23 y.o. female who presents for a postpartum visit. She is 4 weeks postpartum following a spontaneous vaginal delivery. I have fully reviewed the prenatal and intrapartum course. The delivery was at 39 gestational weeks. Outcome: spontaneous vaginal delivery. Anesthesia: epidural. Postpartum course has been complicated by uncontrollable crying and sadness. Baby's course has been normal. Baby is feeding by breast. Bleeding thin lochia. Bowel function is normal. Bladder function is normal. Patient is not sexually active. Contraception method is abstinence. Postpartum depression screening: negative.  The following portions of the patient's history were reviewed and updated as appropriate: allergies, current medications, past family history, past medical history, past social history, past surgical history and problem list.  Review of Systems A comprehensive review of systems was negative except for: Behavioral/Psych: positive for depression   Objective:    BP 99/66  Pulse 69  Temp(Src) 97.9 F (36.6 C)  Ht 5\' 2"  (1.575 m)  Wt 145 lb (65.772 kg)  BMI 26.51 kg/m2  Breastfeeding? Yes  General:  alert and no distress PE:  Deferred     Assessment:    Postpartum Depression  Contraceptive counseling done.  Plan:    1. Contraception: IUD 2. Mirena IUD Rx 3. Follow up in: 8 weeks or as needed.  4. Zoloft Rx

## 2014-03-28 ENCOUNTER — Telehealth: Payer: Self-pay | Admitting: *Deleted

## 2014-03-28 NOTE — Telephone Encounter (Signed)
Patient states she is having trouble producing milk. She has tried the fenugreek and it is not helping. The lactation consultant told her to call us for an RX- but with her depression, i am not sure that she can take the Reglan. Please advise.

## 2014-04-02 NOTE — Telephone Encounter (Signed)
No Reglan.

## 2014-04-02 NOTE — Telephone Encounter (Signed)
Patient informed- is still using Fenugreek and has increased her dosage. Seems to be helping.

## 2014-05-07 ENCOUNTER — Ambulatory Visit (INDEPENDENT_AMBULATORY_CARE_PROVIDER_SITE_OTHER): Payer: Medicaid Other | Admitting: Obstetrics

## 2014-05-07 ENCOUNTER — Encounter: Payer: Self-pay | Admitting: Obstetrics

## 2014-05-07 ENCOUNTER — Other Ambulatory Visit: Payer: Self-pay | Admitting: Obstetrics

## 2014-05-07 VITALS — BP 119/74 | HR 69 | Ht 62.0 in | Wt 140.0 lb

## 2014-05-07 DIAGNOSIS — Z3202 Encounter for pregnancy test, result negative: Secondary | ICD-10-CM

## 2014-05-07 DIAGNOSIS — Z01812 Encounter for preprocedural laboratory examination: Secondary | ICD-10-CM

## 2014-05-07 DIAGNOSIS — Z30430 Encounter for insertion of intrauterine contraceptive device: Secondary | ICD-10-CM

## 2014-05-07 DIAGNOSIS — N946 Dysmenorrhea, unspecified: Secondary | ICD-10-CM

## 2014-05-07 MED ORDER — OXYCODONE HCL 10 MG PO TABS: 10.0000 mg | ORAL_TABLET | Freq: Four times a day (QID) | ORAL | Status: DC | PRN

## 2014-05-07 NOTE — Progress Notes (Signed)
IUD Insertion Procedure Note  Pre-operative Diagnosis: Contraception  Post-operative Diagnosis: same  Indications: contraception  Procedure Details  Urine pregnancy test was done in office and result was negative.  The risks (including infection, bleeding, pain, and uterine perforation) and benefits of the procedure were explained to the patient and Written informed consent was obtained.    Cervix cleansed with Betadine. Uterus sounded to 9 cm. IUD inserted without difficulty. String visible and trimmed. Patient tolerated procedure well.  IUD Information: Mirena, Lot # [id number], Expiration date 08 / 17.  Condition: Stable  Complications: None  Plan:  The patient was advised to call for any fever or for prolonged or severe pain or bleeding. She was advised to use NSAID as needed for mild to moderate pain.   Attending Physician Documentation: I was present for or participated in the entire procedure, including opening and closing.

## 2014-05-08 LAB — WET PREP BY MOLECULAR PROBE
Candida species: NEGATIVE
Gardnerella vaginalis: NEGATIVE
Trichomonas vaginosis: NEGATIVE

## 2014-05-09 LAB — GC/CHLAMYDIA PROBE AMP
CT Probe RNA: NEGATIVE
GC Probe RNA: NEGATIVE

## 2014-05-31 ENCOUNTER — Telehealth: Payer: Self-pay | Admitting: Obstetrics

## 2014-05-31 NOTE — Telephone Encounter (Signed)
Called pt to [facility] an appt for this afternoon but pt stated to be at the hospital to check that everything is ok.  [place]

## 2014-06-11 ENCOUNTER — Telehealth: Payer: Self-pay | Admitting: *Deleted

## 2014-06-11 NOTE — Telephone Encounter (Signed)
Patient states she is having problems with her IUD- since August. 11:57 LM on VM to CB

## 2014-06-12 ENCOUNTER — Telehealth: Payer: Self-pay

## 2014-06-12 ENCOUNTER — Ambulatory Visit (INDEPENDENT_AMBULATORY_CARE_PROVIDER_SITE_OTHER): Payer: Medicaid Other | Admitting: Obstetrics

## 2014-06-12 ENCOUNTER — Other Ambulatory Visit: Payer: Self-pay | Admitting: Obstetrics

## 2014-06-12 ENCOUNTER — Encounter: Payer: Self-pay | Admitting: Obstetrics

## 2014-06-12 VITALS — BP 113/73 | HR 82 | Temp 97.1°F | Ht 62.0 in | Wt 137.0 lb

## 2014-06-12 DIAGNOSIS — M549 Dorsalgia, unspecified: Secondary | ICD-10-CM

## 2014-06-12 DIAGNOSIS — Z30431 Encounter for routine checking of intrauterine contraceptive device: Secondary | ICD-10-CM

## 2014-06-12 DIAGNOSIS — N390 Urinary tract infection, site not specified: Secondary | ICD-10-CM

## 2014-06-12 MED ORDER — OXYCODONE HCL 10 MG PO TABS: 10.0000 mg | ORAL_TABLET | Freq: Four times a day (QID) | ORAL | Status: DC | PRN

## 2014-06-12 MED ORDER — CYCLOBENZAPRINE HCL 10 MG PO TABS: 10.0000 mg | ORAL_TABLET | Freq: Three times a day (TID) | ORAL | Status: DC | PRN

## 2014-06-12 NOTE — Progress Notes (Signed)
Subjective:    Victoria Humphrey is a 23 y.o. female who presents for contraception follow up. The patient has no complaints today. The patient is sexually active. Pertinent past medical history: migraines.  The information documented in the HPI was reviewed and verified.  Menstrual History: OB History   Grav Para Term Preterm Abortions TAB SAB Ect Mult Living   Patient's last menstrual period was 06/07/2014.   Patient Active Problem List   Diagnosis Date Noted  . Depression affecting pregnancy, postpartum 03/13/2014  . History of postpartum depression 02/16/2014  . Pregnancy 02/14/2014  . Normal delivery 02/14/2014  . Gestational diabetes 01/10/2014  . Low backache 01/10/2014  . Rh negative state in antepartum period 12/29/2013  . Preterm labor in second trimester without delivery 12/28/2013  . Backache 11/14/2013  . Choroid plexus cyst of fetus on prenatal ultrasound 10/11/2013  . Pain 08/02/2013  . HA (headache) 08/02/2013  . Unspecified high-risk pregnancy 07/10/2013  . Personal history of pre-term labor 07/10/2013  . Hx of preterm delivery, currently pregnant 07/10/2013  . Nausea and vomiting in pregnancy prior to [redacted] weeks gestation 07/10/2013  . Postmaturity pregnancy, 40-[redacted] weeks gestation 11/03/2011  . Pregnancy headache, antepartum 05/12/2011   Past Medical History  Diagnosis Date  . Migraine headache   . Preterm labor   . Hx of chlamydia infection 03/2013    treated    Past Surgical History  Procedure Laterality Date  . Dilation and curettage of uterus      Current outpatient prescriptions:cyclobenzaprine (FLEXERIL) 10 MG tablet, Take 1 tablet (10 mg total) by mouth 3 (three) times daily as needed for muscle spasms., Disp: 30 tablet, Rfl: 2;  ibuprofen (ADVIL,MOTRIN) 600 MG tablet, Take 1 tablet (600 mg total) by mouth every 6 (six) hours., Disp: 30 tablet, Rfl: 0;  Oxycodone HCl 10 MG TABS, Take 1 tablet (10 mg total) by mouth every 6 (six)  hours as needed., Disp: 40 tablet, Rfl: 0 No Known Allergies  History  Substance Use Topics  . Smoking status: Never Smoker   . Smokeless tobacco: Never Used  . Alcohol Use: No    Family History  Problem Relation Age of Onset  . Anesthesia problems Neg Hx   . Diabetes Father   . Birth defects Brother     spina bifida  . Diabetes Paternal Aunt   . Diabetes Paternal Uncle   . Cancer Paternal Grandmother     breast and thyroid, stomach  . 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 Neuro:  Positive for Migraines  Objective:   BP 113/73  Pulse 82  Temp(Src) 97.1 F (36.2 C)  Ht  (1.575 m)  Wt 137 lb (62.143 kg)  BMI 25.05 kg/m2  LMP 06/07/2014  Breastfeeding? No   General:   alert  Skin:   no rash or abnormalities  Lungs:   clear to auscultation bilaterally  Heart:   regular rate and rhythm, S1, S2 normal, no murmur, click, rub or gallop  Breasts:   normal without suspicious masses, skin or nipple changes or axillary nodes  Abdomen:  normal findings: no organomegaly, soft, non-tender and no hernia  Pelvis:  External genitalia: normal general appearance Urinary system: urethral meatus normal and  bladder without fullness, nontender Vaginal: normal without tenderness, induration or masses Cervix: normal appearance Adnexa: normal bimanual exam Uterus: anteverted and non-tender, normal size   Lab Review Urine pregnancy test Labs reviewed yes Radiologic studies reviewed no    Assessment:    23 y.o., continuing IUD, no contraindications.  Chronic backache  Plan:    All questions answered. Discussed healthy lifestyle modifications. Referred to Orthopedics for evaluation of chronic backache.   Meds ordered this encounter  Medications  . Oxycodone HCl  10 MG TABS    Sig: Take 1 tablet (10 mg total) by mouth every 6 (six) hours as needed.    Dispense:  40 tablet    Refill:  0  . cyclobenzaprine (FLEXERIL) 10 MG tablet    Sig: Take 1 tablet (10 mg total) by mouth 3 (three) times daily as needed for muscle spasms.    Dispense:  30 tablet    Refill:  2   Orders Placed This Encounter  Procedures  . WET PREP BY MOLECULAR PROBE  . GC/Chlamydia Probe Amp  . AMB referral to orthopedics    Referral Priority:  Routine    Referral Type:  Consultation    Number of Visits Requested:  1  . POCT urinalysis dipstick

## 2014-06-12 NOTE — Telephone Encounter (Signed)
Told patient about appt with Delbert Harness - Dr. Farris Has in 06/29/14 @ 9am

## 2014-06-13 LAB — WET PREP BY MOLECULAR PROBE
Candida species: NEGATIVE
Gardnerella vaginalis: NEGATIVE
Trichomonas vaginosis: NEGATIVE

## 2014-06-13 LAB — GC/CHLAMYDIA PROBE AMP
CT Probe RNA: NEGATIVE
GC Probe RNA: NEGATIVE

## 2014-06-14 NOTE — Telephone Encounter (Signed)
Patient was seen in office

## 2014-06-18 ENCOUNTER — Ambulatory Visit: Payer: Medicaid Other | Admitting: Obstetrics

## 2014-07-23 ENCOUNTER — Encounter: Payer: Self-pay | Admitting: Obstetrics

## 2014-08-13 ENCOUNTER — Ambulatory Visit: Payer: Medicaid Other | Admitting: Obstetrics

## 2014-09-17 ENCOUNTER — Encounter: Payer: Self-pay | Admitting: *Deleted

## 2014-10-15 ENCOUNTER — Telehealth: Payer: Self-pay | Admitting: *Deleted

## 2014-10-15 NOTE — Telephone Encounter (Signed)
Patient called requesting an appointment.  Attempted to contact the patient and left message for patient to call the office.

## 2014-10-18 NOTE — Telephone Encounter (Signed)
Patient called the office 2:27 pm. Contacted patient and she states she would like to have her IUD checked. Patient states she has not had a period since it was inserted and now she is having a period. Patient states she would like for the doctor to trim her strings. Patient scheduled for an appointment on 11-07-14 @ 1:30 pm per her request.

## 2014-11-07 ENCOUNTER — Ambulatory Visit: Payer: Self-pay | Admitting: Obstetrics

## 2014-12-20 ENCOUNTER — Telehealth: Payer: Self-pay | Admitting: *Deleted

## 2014-12-20 NOTE — Telephone Encounter (Signed)
Patient is in need of an appointment for an IUD check. Patient state she is having some pain on her right side. Patient states she has been unable to take off of work to come in to be seen. Patient states she has a week off in May 2016. Patient is also in need of an annual exam. Patient has been scheduled for Feb 14, 2015 for her annual exam and an IUD check. Patient advised if pain becomes worse she should be seen in the emergency room. Patient verbalized understanding.

## 2015-01-04 ENCOUNTER — Telehealth: Payer: Self-pay | Admitting: *Deleted

## 2015-01-04 NOTE — Telephone Encounter (Signed)
Patient contacted the office stating she has a yeast infection and has tried AcupuncturistMonastat over the counter and it has not helped her symptoms. Patient is requesting a prescription for Diflucan. Attempted to contact the patient and unable to leave message because the voicemail box is full.

## 2015-01-18 ENCOUNTER — Inpatient Hospital Stay (HOSPITAL_COMMUNITY): Payer: Medicaid Other

## 2015-01-18 ENCOUNTER — Encounter (HOSPITAL_COMMUNITY): Payer: Self-pay | Admitting: *Deleted

## 2015-01-18 ENCOUNTER — Inpatient Hospital Stay (HOSPITAL_COMMUNITY)
Admission: AD | Admit: 2015-01-18 | Discharge: 2015-01-18 | Disposition: A | Discharge status: Home / Self Care | Payer: Medicaid Other | Source: Ambulatory Visit | Attending: Obstetrics | Admitting: Obstetrics

## 2015-01-18 DIAGNOSIS — T8389XA Other specified complication of genitourinary prosthetic devices, implants and grafts, initial encounter: Secondary | ICD-10-CM

## 2015-01-18 DIAGNOSIS — N939 Abnormal uterine and vaginal bleeding, unspecified: Secondary | ICD-10-CM | POA: Insufficient documentation

## 2015-01-18 DIAGNOSIS — Z975 Presence of (intrauterine) contraceptive device: Secondary | ICD-10-CM

## 2015-01-18 DIAGNOSIS — T839XXA Unspecified complication of genitourinary prosthetic device, implant and graft, initial encounter: Secondary | ICD-10-CM | POA: Insufficient documentation

## 2015-01-18 DIAGNOSIS — N921 Excessive and frequent menstruation with irregular cycle: Secondary | ICD-10-CM | POA: Diagnosis not present

## 2015-01-18 DIAGNOSIS — R102 Pelvic and perineal pain: Secondary | ICD-10-CM

## 2015-01-18 DIAGNOSIS — M549 Dorsalgia, unspecified: Secondary | ICD-10-CM | POA: Insufficient documentation

## 2015-01-18 LAB — URINALYSIS, ROUTINE W REFLEX MICROSCOPIC
Bilirubin Urine: NEGATIVE
Glucose, UA: NEGATIVE mg/dL
Hgb urine dipstick: NEGATIVE
Ketones, ur: NEGATIVE mg/dL
Leukocytes, UA: NEGATIVE
Nitrite: NEGATIVE
Protein, ur: NEGATIVE mg/dL
Specific Gravity, Urine: 1.015 (ref 1.005–1.030)
Urobilinogen, UA: 1 mg/dL (ref 0.0–1.0)
pH: 6.5 (ref 5.0–8.0)

## 2015-01-18 LAB — CBC
HCT: 39.7 % (ref 36.0–46.0)
Hemoglobin: 14.2 g/dL (ref 12.0–15.0)
MCH: 31.6 pg (ref 26.0–34.0)
MCHC: 35.8 g/dL (ref 30.0–36.0)
MCV: 88.2 fL (ref 78.0–100.0)
Platelets: 250 10*3/uL (ref 150–400)
RBC: 4.5 MIL/uL (ref 3.87–5.11)
RDW: 12.5 % (ref 11.5–15.5)
WBC: 4.7 10*3/uL (ref 4.0–10.5)

## 2015-01-18 LAB — WET PREP, GENITAL
Clue Cells Wet Prep HPF POC: NONE SEEN
Trich, Wet Prep: NONE SEEN
Yeast Wet Prep HPF POC: NONE SEEN

## 2015-01-18 LAB — POCT PREGNANCY, URINE: Preg Test, Ur: NEGATIVE

## 2015-01-18 MED ORDER — NORGESTIMATE-ETH ESTRADIOL 0.25-35 MG-MCG PO TABS: 1.0000 | ORAL_TABLET | Freq: Every day | ORAL | Status: DC

## 2015-01-18 MED ORDER — AZITHROMYCIN 250 MG PO TABS
1000.0000 mg | ORAL_TABLET | Freq: Once | ORAL | Status: AC
  Administered 2015-01-18: 1000 mg via ORAL
  Filled 2015-01-18: qty 4

## 2015-01-18 MED ORDER — IBUPROFEN 600 MG PO TABS: 600.0000 mg | ORAL_TABLET | Freq: Four times a day (QID) | ORAL | Status: DC | PRN

## 2015-01-18 NOTE — MAU Provider Note (Signed)
Chief Complaint: Pelvic Pain   First Provider Initiated Contact with Patient 01/18/15 1021      SUBJECTIVE HPI: Victoria Humphrey is a 24 y.o. [id number] who presents to maternity admissions reporting pelvic and back pain x 2 months, worsening in the last few days. She also reports irregular vaginal bleeding, none today, with IUD in place x 8 months.  She is concerned about the Mirena IUD and wants to make sure it is in place where it belongs. She also reports possible exposure to chlamydia because she found out her boyfriend's ex-girlfriend tested positive.  She denies vaginal bleeding, vaginal itching/burning, urinary symptoms, h/a, dizziness, n/v, or fever/chills.     Pelvic Pain The patient's primary symptoms include pelvic pain and vaginal bleeding. This is a new problem. The current episode started more than 1 month ago. The problem occurs daily. The problem has been gradually worsening. The pain is moderate. The problem affects both sides. She is not pregnant. Associated symptoms include abdominal pain and back pain. Pertinent negatives include no chills, constipation, diarrhea, dysuria, fever, frequency, headaches, nausea, urgency or vomiting. The vaginal discharge was thin, scant and white. The vaginal bleeding is lighter than menses. She has not been passing clots. She has not been passing tissue. Nothing aggravates the symptoms. She has tried NSAIDs for the symptoms. The treatment provided mild relief. She is sexually active. It is possible that her partner has an STD. She uses an IUD for contraception. Her menstrual history has been irregular (Since IUD placement). Her past medical history is significant for an STD.    Past Medical History  Diagnosis Date  . Migraine headache   . Preterm labor   . Hx of chlamydia infection 03/2013    treated   Past Surgical History  Procedure Laterality Date  . Dilation and curettage of uterus     History   Social History  . Marital Status: Legally  Separated    Spouse Name: N/A  . Number of Children: N/A  . Years of Education: N/A   Occupational History  . Not on file.   Social History Main Topics  . Smoking status: Never Smoker   . Smokeless tobacco: Never Used  . Alcohol Use: No  . Drug Use: No  . Sexual Activity:    Partners: Male    Birth Control/ Protection: IUD   Other Topics Concern  . Not on file   Social History Narrative   No current facility-administered medications on file prior to encounter.   No current outpatient prescriptions on file prior to encounter.   No Known Allergies  Review of Systems  Constitutional: Negative for fever, chills and malaise/fatigue.  Eyes: Negative for blurred vision.  Respiratory: Negative for cough and shortness of breath.   Cardiovascular: Negative for chest pain.  Gastrointestinal: Positive for abdominal pain. Negative for heartburn, nausea, vomiting, diarrhea and constipation.  Genitourinary: Positive for pelvic pain. Negative for dysuria, urgency and frequency.  Musculoskeletal: Positive for back pain.  Neurological: Negative for dizziness and headaches.  Psychiatric/Behavioral: Negative for depression.    OBJECTIVE Blood pressure 103/61, pulse 80, temperature 97.5 F (36.4 C), temperature source Oral, resp. rate 16, not currently breastfeeding. GENERAL: Well-developed, well-nourished female in no acute distress.  EYES: normal sclera/conjunctiva; no lid-lag HENT: Atraumatic, normocephalic HEART: normal rate RESP: normal effort ABDOMEN: Soft, non-tender MUSCULOSKELETAL: Normal ROM, tenderness to palpation of bilateral lower back, Negative CVA tenderness EXTREMITIES: Nontender, no edema NEURO/PSYCH: Alert and oriented, appropriate affect  PELVIC EXAM: Cervix pink,  visually closed, without lesion, scant white creamy discharge, Mirena string visible at os, vaginal walls and external genitalia normal Bimanual exam: Cervix 0/long/high, firm, anterior, neg CMT, uterus  nontender, nonenlarged, adnexa without tenderness, enlargement, or mass   LAB RESULTS Results for orders placed or performed during the hospital encounter of 01/18/15 (from the past 24 hour(s))  Urinalysis, Routine w reflex microscopic     Status: None   Collection Time: 01/18/15  9:38 AM  Result Value Ref Range   Color, Urine YELLOW YELLOW   APPearance CLEAR CLEAR   Specific Gravity, Urine 1.015 1.005 - 1.030   pH 6.5 5.0 - 8.0   Glucose, UA NEGATIVE NEGATIVE mg/dL   Hgb urine dipstick NEGATIVE NEGATIVE   Bilirubin Urine NEGATIVE NEGATIVE   Ketones, ur NEGATIVE NEGATIVE mg/dL   Protein, ur NEGATIVE NEGATIVE mg/dL   Urobilinogen, UA 1.0 0.0 - 1.0 mg/dL   Nitrite NEGATIVE NEGATIVE   Leukocytes, UA NEGATIVE NEGATIVE  Wet prep, genital     Status: Abnormal   Collection Time: 01/18/15 10:15 AM  Result Value Ref Range   Yeast Wet Prep HPF POC NONE SEEN NONE SEEN   Trich, Wet Prep NONE SEEN NONE SEEN   Clue Cells Wet Prep HPF POC NONE SEEN NONE SEEN   WBC, Wet Prep HPF POC FEW (A) NONE SEEN  CBC     Status: None   Collection Time: 01/18/15 10:40 AM  Result Value Ref Range   WBC 4.7 4.0 - 10.5 K/uL   RBC 4.50 3.87 - 5.11 MIL/uL   Hemoglobin 14.2 12.0 - 15.0 g/dL   HCT 16.139.7 09.636.0 - 04.546.0 %   MCV 88.2 78.0 - 100.0 fL   MCH 31.6 26.0 - 34.0 pg   MCHC 35.8 30.0 - 36.0 g/dL   RDW 40.912.5 81.111.5 - 91.415.5 %   Platelets 250 150 - 400 K/uL    IMAGING [place] Transvaginal Non-ob  01/18/2015   CLINICAL DATA:  Back/pelvic pain, Mirena IUD inserted 05/07/2014  EXAM: TRANSABDOMINAL AND TRANSVAGINAL ULTRASOUND OF PELVIS  TECHNIQUE: Both transabdominal and transvaginal ultrasound examinations of the pelvis were performed. Transabdominal technique was performed for global imaging of the pelvis including uterus, ovaries, adnexal regions, and pelvic cul-de-sac. It was necessary to proceed with endovaginal exam following the transabdominal exam to visualize the endometrium.  COMPARISON:  None  FINDINGS: Uterus   Measurements: 9.5 x 4.6 x 4.9 cm. No fibroids or other mass visualized.  Endometrium  Obscured due to indwelling IUD. Right arm of the IUD may be embedded in the myometrium (image 57). Otherwise in satisfactory position.  Right ovary  Measurements: 3.2 x 2.6 x 3.4 cm. Normal appearance/no adnexal mass.  Left ovary  Measurements: 3.7 x 2.0 x 2.9 cm. Normal appearance/no adnexal mass.  Other findings  No free fluid.  IMPRESSION: Right arm of the IUD may be embedded in the myometrium. Otherwise in satisfactory position.   Electronically Signed   By: Charline BillsSriyesh  Krishnan M.D.   On: 01/18/2015 11:49   [place] Pelvis Complete  01/18/2015   CLINICAL DATA:  Back/pelvic pain, Mirena IUD inserted 05/07/2014  EXAM: TRANSABDOMINAL AND TRANSVAGINAL ULTRASOUND OF PELVIS  TECHNIQUE: Both transabdominal and transvaginal ultrasound examinations of the pelvis were performed. Transabdominal technique was performed for global imaging of the pelvis including uterus, ovaries, adnexal regions, and pelvic cul-de-sac. It was necessary to proceed with endovaginal exam following the transabdominal exam to visualize the endometrium.  COMPARISON:  None  FINDINGS: Uterus  Measurements: 9.5 x 4.6 x 4.9  cm. No fibroids or other mass visualized.  Endometrium  Obscured due to indwelling IUD. Right arm of the IUD may be embedded in the myometrium (image 57). Otherwise in satisfactory position.  Right ovary  Measurements: 3.2 x 2.6 x 3.4 cm. Normal appearance/no adnexal mass.  Left ovary  Measurements: 3.7 x 2.0 x 2.9 cm. Normal appearance/no adnexal mass.  Other findings  No free fluid.  IMPRESSION: Right arm of the IUD may be embedded in the myometrium. Otherwise in satisfactory position.   Electronically Signed   By: Charline Bills M.D.   On: 01/18/2015 11:49    ASSESSMENT 1. Breakthrough bleeding associated with intrauterine device (IUD)   2. Pelvic pain in female   3. IUD complication, initial encounter     PLAN Discharge home Discussed  U/S findings with pt, likely normal but IUD possibly embedded slightly in endometrium Ibuprofen 600 mg Q 6 hours Sprintec 28, take 1 tab Q day x 1 month, with 3 refills to use PRN bleeding F/U with [facility] if pain, breakthrough bleeding persist   Follow-up Information    Schedule an appointment as soon as possible for a visit with [facility].   Contact information:   [address] [place] [address] [phone]      Follow up with [facility] [place] MATERNITY ADMISSIONS.   Why:  As needed for emergencies   Contact information:   [address] [id number] mc [place] [address] [phone]      Sharen Counter Certified Nurse-Midwife 01/18/2015  1:03 PM

## 2015-01-18 NOTE — Discharge Instructions (Signed)
Metrorrhagia  Metrorrhagia is uterine bleeding at irregular intervals, especially between menstrual periods.  CAUSES   Dysfunctional uterine bleeding.  Uterine lining growing outside the uterus (endometriosis).  Embryo adhering to uterine wall (implantation).  Pregnancy growing in the fallopian tubes (ectopic pregnancy).  Miscarriage.  Menopause.  Cancer of the reproduction organs.  Certain drugs such as hormonal contraceptives.  Inherited bleeding disorders.  Trauma.  Uterine fibroids.  Sexually transmitted diseases (STDs).  Polycystic ovarian disease. DIAGNOSIS  A history will be taken.  A physical exam will be performed.  Other tests may include:  Blood tests.  A pregnancy test.  An ultrasound of the abdomen and pelvis.  A biopsy of the uterine lining.  AMRI or CT scan of the abdomen and pelvis. TREATMENT Treatment will depend on the cause. HOME CARE INSTRUCTIONS   Take all medicines as directed by your caregiver. Do not change or switch medicines without talking to your caregiver.  Take all iron supplements exactly as directed by your caregiver. Iron supplements help to replace the iron your body loses from irregular bleeding.If you become constipated, increase the amount of fiber, fruits, and vegetables in your diet.  Do not take aspirin or medicines that contain aspirin for 1 week before your menstrual period or during your menstrual period. Aspirin may increase the bleeding.  Rest as much as possible if you change your sanitary pad or tampon more than once every 2 hours.  Eat well-balanced meals including foods high in iron, such as green leafy vegetables, red meat, liver, eggs, and whole-grain breads and cereals.  Do not try to lose weight until the abnormal bleeding is controlled and your blood iron level is back to normal. SEEK MEDICAL CARE IF:   You have nausea and vomiting, or you cannot keep foods down.  You feel dizzy or have diarrhea  while taking medicine.  You have any problems that may be related to the medicine you are taking. SEEK IMMEDIATE MEDICAL CARE IF:   You have a fever.  You develop chills.  You become lightheaded or faint.  You need to change your sanitary pad or tampon more than once an hour.  Your bleeding becomesheavy.  You begin to pass clots or tissue. MAKE SURE YOU:   Understand these instructions.  Will watch your condition.  Will get help right away if you are not doing well or get worse. Document Released: 09/07/2005 Document Revised: 11/30/2011 Document Reviewed: 04/06/2011 [facility] Patient Information 2015 [place], [place]. This information is not intended to replace advice given to you by your health care provider. Make sure you discuss any questions you have with your health care provider. Pelvic Pain Female pelvic pain can be caused by many different things and start from a variety of places. Pelvic pain refers to pain that is located in the lower half of the abdomen and between your hips. The pain may occur over a short period of time (acute) or may be reoccurring (chronic). The cause of pelvic pain may be related to disorders affecting the female reproductive organs (gynecologic), but it may also be related to the bladder, kidney stones, an intestinal complication, or muscle or skeletal problems. Getting help right away for pelvic pain is important, especially if there has been severe, sharp, or a sudden onset of unusual pain. It is also important to get help right away because some types of pelvic pain can be life threatening.  CAUSES  Below are only some of the causes of pelvic pain. The causes  of pelvic pain can be in one of several categories.   Gynecologic.  Pelvic inflammatory disease.  Sexually transmitted infection.  Ovarian cyst or a twisted ovarian ligament (ovarian torsion).  Uterine lining that grows outside the uterus (endometriosis).  Fibroids, cysts, or  tumors.  Ovulation.  Pregnancy.  Pregnancy that occurs outside the uterus (ectopic pregnancy).  Miscarriage.  Labor.  Abruption of the placenta or ruptured uterus.  Infection.  Uterine infection (endometritis).  Bladder infection.  Diverticulitis.  Miscarriage related to a uterine infection (septic abortion).  Bladder.  Inflammation of the bladder (cystitis).  Kidney stone(s).  Gastrointestinal.  Constipation.  Diverticulitis.  Neurologic.  Trauma.  Feeling pelvic pain because of mental or emotional causes (psychosomatic).  Cancers of the bowel or pelvis. EVALUATION  Your caregiver will want to take a careful history of your concerns. This includes recent changes in your health, a careful gynecologic history of your periods (menses), and a sexual history. Obtaining your family history and medical history is also important. Your caregiver may suggest a pelvic exam. A pelvic exam will help identify the location and severity of the pain. It also helps in the evaluation of which organ system may be involved. In order to identify the cause of the pelvic pain and be properly treated, your caregiver may order tests. These tests may include:   A pregnancy test.  Pelvic ultrasonography.  An X-ray exam of the abdomen.  A urinalysis or evaluation of vaginal discharge.  Blood tests. HOME CARE INSTRUCTIONS   Only take over-the-counter or prescription medicines for pain, discomfort, or fever as directed by your caregiver.   Rest as directed by your caregiver.   Eat a balanced diet.   Drink enough fluids to make your urine clear or pale yellow, or as directed.   Avoid sexual intercourse if it causes pain.   Apply warm or cold compresses to the lower abdomen depending on which one helps the pain.   Avoid stressful situations.   Keep a journal of your pelvic pain. Write down when it started, where the pain is located, and if there are things that seem to  be associated with the pain, such as food or your menstrual cycle.  Follow up with your caregiver as directed.  SEEK MEDICAL CARE IF:  Your medicine does not help your pain.  You have abnormal vaginal discharge. SEEK IMMEDIATE MEDICAL CARE IF:   You have heavy bleeding from the vagina.   Your pelvic pain increases.   You feel light-headed or faint.   You have chills.   You have pain with urination or blood in your urine.   You have uncontrolled diarrhea or vomiting.   You have a fever or persistent symptoms for more than 3 days.  You have a fever and your symptoms suddenly get worse.   You are being physically or sexually abused.  MAKE SURE YOU:  Understand these instructions.  Will watch your condition.  Will get help if you are not doing well or get worse. Document Released: 08/04/2004 Document Revised: 01/22/2014 Document Reviewed: 12/28/2011 [facility] Patient Information 2015 [place], [place]. This information is not intended to replace advice given to you by your health care provider. Make sure you discuss any questions you have with your health care provider.

## 2015-01-18 NOTE — MAU Note (Signed)
C/o pelvic pain and lower back pain for past 2-3 months; problems with IUD for 2-3 months; has been exposed to STD;

## 2015-01-18 NOTE — MAU Note (Signed)
Got IUD in August and has been having irregular bright red bleeding for past 2-3 months;

## 2015-01-19 LAB — HIV ANTIBODY (ROUTINE TESTING W REFLEX): HIV Screen 4th Generation wRfx: NONREACTIVE

## 2015-01-19 LAB — RPR: RPR Ser Ql: NONREACTIVE

## 2015-01-21 LAB — GC/CHLAMYDIA PROBE AMP (~~LOC~~) NOT AT ARMC
Chlamydia: NEGATIVE
Neisseria Gonorrhea: NEGATIVE

## 2015-01-21 LAB — HEPATITIS C ANTIBODY (REFLEX): HCV Ab: NEGATIVE

## 2015-01-21 NOTE — Telephone Encounter (Signed)
Patient was seen in MAU on 01-18-15

## 2015-02-14 ENCOUNTER — Ambulatory Visit: Payer: Self-pay | Admitting: Obstetrics

## 2015-03-19 ENCOUNTER — Encounter (HOSPITAL_COMMUNITY): Payer: Self-pay | Admitting: *Deleted

## 2015-03-19 ENCOUNTER — Inpatient Hospital Stay (HOSPITAL_COMMUNITY)
Admission: AD | Admit: 2015-03-19 | Discharge: 2015-03-19 | Disposition: A | Discharge status: Home / Self Care | Payer: Commercial Indemnity | Source: Ambulatory Visit | Attending: Obstetrics | Admitting: Obstetrics

## 2015-03-19 ENCOUNTER — Inpatient Hospital Stay (HOSPITAL_COMMUNITY): Payer: Commercial Indemnity

## 2015-03-19 DIAGNOSIS — N939 Abnormal uterine and vaginal bleeding, unspecified: Secondary | ICD-10-CM | POA: Diagnosis present

## 2015-03-19 DIAGNOSIS — R109 Unspecified abdominal pain: Secondary | ICD-10-CM | POA: Diagnosis not present

## 2015-03-19 DIAGNOSIS — N8320 Unspecified ovarian cysts: Secondary | ICD-10-CM | POA: Insufficient documentation

## 2015-03-19 DIAGNOSIS — T8389XA Other specified complication of genitourinary prosthetic devices, implants and grafts, initial encounter: Secondary | ICD-10-CM | POA: Insufficient documentation

## 2015-03-19 DIAGNOSIS — N83201 Unspecified ovarian cyst, right side: Secondary | ICD-10-CM

## 2015-03-19 DIAGNOSIS — R1031 Right lower quadrant pain: Secondary | ICD-10-CM

## 2015-03-19 LAB — CBC
HCT: 38.1 % (ref 36.0–46.0)
Hemoglobin: 13.4 g/dL (ref 12.0–15.0)
MCH: 31.4 pg (ref 26.0–34.0)
MCHC: 35.2 g/dL (ref 30.0–36.0)
MCV: 89.2 fL (ref 78.0–100.0)
Platelets: 210 10*3/uL (ref 150–400)
RBC: 4.27 MIL/uL (ref 3.87–5.11)
RDW: 13 % (ref 11.5–15.5)
WBC: 5.1 10*3/uL (ref 4.0–10.5)

## 2015-03-19 LAB — URINALYSIS, ROUTINE W REFLEX MICROSCOPIC
Bilirubin Urine: NEGATIVE
Glucose, UA: NEGATIVE mg/dL
Ketones, ur: NEGATIVE mg/dL
Nitrite: NEGATIVE
Protein, ur: NEGATIVE mg/dL
Specific Gravity, Urine: 1.01 (ref 1.005–1.030)
Urobilinogen, UA: 0.2 mg/dL (ref 0.0–1.0)
pH: 7 (ref 5.0–8.0)

## 2015-03-19 LAB — URINE MICROSCOPIC-ADD ON

## 2015-03-19 LAB — WET PREP, GENITAL
Clue Cells Wet Prep HPF POC: NONE SEEN
Trich, Wet Prep: NONE SEEN
Yeast Wet Prep HPF POC: NONE SEEN

## 2015-03-19 LAB — HIV ANTIBODY (ROUTINE TESTING W REFLEX): HIV Screen 4th Generation wRfx: NONREACTIVE

## 2015-03-19 LAB — POCT PREGNANCY, URINE: Preg Test, Ur: NEGATIVE

## 2015-03-19 LAB — RPR: RPR Ser Ql: NONREACTIVE

## 2015-03-19 MED ORDER — ONDANSETRON 8 MG PO TBDP
8.0000 mg | ORAL_TABLET | Freq: Once | ORAL | Status: AC
  Administered 2015-03-19: 8 mg via ORAL
  Filled 2015-03-19: qty 1

## 2015-03-19 MED ORDER — IBUPROFEN 800 MG PO TABS: 800.0000 mg | ORAL_TABLET | Freq: Three times a day (TID) | ORAL | Status: DC

## 2015-03-19 MED ORDER — KETOROLAC TROMETHAMINE 60 MG/2ML IM SOLN
60.0000 mg | Freq: Once | INTRAMUSCULAR | Status: AC
  Administered 2015-03-19: 60 mg via INTRAMUSCULAR
  Filled 2015-03-19: qty 2

## 2015-03-19 NOTE — MAU Provider Note (Signed)
History     CSN: [id number]  Arrival date and time: 03/19/15 4132   None     Chief Complaint  Patient presents with  . Back Pain  . Vaginal Bleeding   HPI Pt is 24 yo not pregnant G4P2 SAB1 with c/o right sided pain associated with vaginal bleeding Pt onset was Friday afternoon.  Pt describes location as right back/flank pain Pt has had some nausea but no vomiting, no chills/fever, constipation, diarrhea or UTI sx. Pt has Mirena IUD inserted in August by Dr. Clearance Coots.  Pt has not had a period/bleeding since insertion. Pt's bleeding started when pain started- pt denies dyspareunia-pt has changed 1 pad/day- not like normal period Pain started after pt had IC. Pt has not taken anything for the pain today and does not want anything for pain at this time RN note:  Signed  MAU Note 03/19/2015 8:40 AM    Expand All Collapse All   C/o vaginal bleeding and R sided pain since Friday afternoon; has IUD and has not had a period since August;       Past Medical History  Diagnosis Date  . Migraine headache   . Preterm labor   . Hx of chlamydia infection 03/2013    treated    Past Surgical History  Procedure Laterality Date  . Dilation and curettage of uterus      Family History  Problem Relation Age of Onset  . Anesthesia problems Neg Hx   . Diabetes Father   . Birth defects Brother     spina bifida  . Diabetes Paternal Aunt   . Diabetes Paternal Uncle   . Cancer Paternal Grandmother     breast and thyroid, stomach  . Diabetes Paternal Grandmother     History  Substance Use Topics  . Smoking status: Never Smoker   . Smokeless tobacco: Never Used  . Alcohol Use: No    Allergies: No Known Allergies  Prescriptions prior to admission  Medication Sig Dispense Refill Last Dose  . folic acid (FOLVITE) 400 MCG tablet Take 400 mcg by mouth daily.   01/17/2015 at Unknown time  . ibuprofen (ADVIL,MOTRIN) 600 MG tablet Take 1 tablet (600 mg total) by mouth every 6 (six) hours  as needed. 30 tablet 0   . norgestimate-ethinyl estradiol (ORTHO-CYCLEN,SPRINTEC,PREVIFEM) 0.25-35 MG-MCG tablet Take 1 tablet by mouth daily. 1 Package 3   . Omega-3 Fatty Acids (FISH OIL PO) Take 1 capsule by mouth daily.   01/17/2015 at Unknown time    Review of Systems  Constitutional: Negative for fever and chills.  Gastrointestinal: Positive for nausea and abdominal pain. Negative for vomiting, diarrhea and constipation.  Genitourinary: Negative for dysuria and urgency.  Musculoskeletal: Positive for back pain.   Physical Exam   Blood pressure 102/65, pulse 65, temperature 98.1 F (36.7 C), temperature source Oral, resp. rate 16, height 5\' 2"  (1.575 m), weight 118 lb (53.524 kg), not currently breastfeeding.  Physical Exam  Nursing note and vitals reviewed. Constitutional: She is oriented to person, place, and time. She appears well-developed and well-nourished. No distress.  HENT:  Head: Normocephalic.  Eyes: Pupils are equal, round, and reactive to light.  Neck: Normal range of motion. Neck supple.  Cardiovascular: Normal rate and regular rhythm.   Respiratory: Effort normal.  No CVA tenderness  GI: Soft. She exhibits no distension. There is tenderness. There is no rebound and no guarding.  Genitourinary:  Small amount of frothy yellow discharge in vault; cervix clean, NT; IUD  strings visibile; uterus nontender and NSSC; Bilateral adnexa tender with palpation- no rebound  Musculoskeletal: Normal range of motion.  Neurological: She is alert and oriented to person, place, and time.  Skin: Skin is warm and dry.  Psychiatric: She has a normal mood and affect.    MAU Course  Procedures CBC HIV, RPR-pending Wet prep GC/chlamydia- pending [place] Results for orders placed or performed during the hospital encounter of 03/19/15 (from the past 24 hour(s))  Urinalysis, Routine w reflex microscopic (not at [facility])     Status: Abnormal   Collection Time: 03/19/15  8:30 AM  Result  Value Ref Range   Color, Urine YELLOW YELLOW   APPearance CLEAR CLEAR   Specific Gravity, Urine 1.010 1.005 - 1.030   pH 7.0 5.0 - 8.0   Glucose, UA NEGATIVE NEGATIVE mg/dL   Hgb urine dipstick TRACE (A) NEGATIVE   Bilirubin Urine NEGATIVE NEGATIVE   Ketones, ur NEGATIVE NEGATIVE mg/dL   Protein, ur NEGATIVE NEGATIVE mg/dL   Urobilinogen, UA 0.2 0.0 - 1.0 mg/dL   Nitrite NEGATIVE NEGATIVE   Leukocytes, UA SMALL (A) NEGATIVE  Urine microscopic-add on     Status: Abnormal   Collection Time: 03/19/15  8:30 AM  Result Value Ref Range   Squamous Epithelial / LPF FEW (A) RARE   WBC, UA 0-2 <3 WBC/hpf   Bacteria, UA RARE RARE  Pregnancy, urine POC     Status: None   Collection Time: 03/19/15  8:39 AM  Result Value Ref Range   Preg Test, Ur NEGATIVE NEGATIVE  CBC     Status: None   Collection Time: 03/19/15  9:04 AM  Result Value Ref Range   WBC 5.1 4.0 - 10.5 K/uL   RBC 4.27 3.87 - 5.11 MIL/uL   Hemoglobin 13.4 12.0 - 15.0 g/dL   HCT 40.9 81.1 - 91.4 %   MCV 89.2 78.0 - 100.0 fL   MCH 31.4 26.0 - 34.0 pg   MCHC 35.2 30.0 - 36.0 g/dL   RDW 78.2 95.6 - 21.3 %   Platelets 210 150 - 400 K/uL  Wet prep, genital     Status: Abnormal   Collection Time: 03/19/15  9:05 AM  Result Value Ref Range   Yeast Wet Prep HPF POC NONE SEEN NONE SEEN   Trich, Wet Prep NONE SEEN NONE SEEN   Clue Cells Wet Prep HPF POC NONE SEEN NONE SEEN   WBC, Wet Prep HPF POC MANY (A) NONE SEEN  US Transvaginal Non-ob  03/19/2015   CLINICAL DATA:  Right lower quadrant and pelvic pain  EXAM: ULTRASOUND PELVIS TRANSVAGINAL  TECHNIQUE: Transvaginal ultrasound examination of the pelvis was performed including evaluation of the uterus, ovaries, adnexal regions, and pelvic cul-de-sac.  COMPARISON:  01/18/2015  FINDINGS: Uterus  Measurements: 7.6 x 5.5 x 5.1 cm. No fibroids or other mass visualized. Uterus is retroverted.  Endometrium  Thickness: 5 mm in thickness. IUD within the endometrium but difficult to visualize due  to uterine position.  Right ovary  Measurements: 5.7 x 5.4 x 5.3 cm. 5.5 cm simple appearing cyst within the right ovary. No internal blood flow or nodularity.  Left ovary  Measurements: 3.6 x 1.1 x 2.7 cm. Normal appearance/no adnexal mass.  Other findings:  No free fluid  IMPRESSION: IUD within the endometrium, somewhat difficult to visualize on today's study due to uterine position. However, this appears to be in the expected position within the endometrial canal.  5.5 cm right ovarian cyst which appears simple.  This is almost certainly benign, but follow up ultrasound is recommended in 1 year according to the Society of Radiologists in Ultrasound2010 Consensus Conference Statement (D Lenis NoonLevine et al. Management of Asymptomatic Ovarian and Other Adnexal Cysts Imaged at US: Society of Radiologists in Ultrasound Consensus Conference Statement 2010. Radiology 256 (Sept 2010): 943-954.).   Electronically Signed   By: Charlett NoseKevin  Dover M.D.   On: 03/19/2015 10:51   Assessment and Plan  Abdominal pain with IUD Right ovarian simple cyst F/u with provider of choice  Victoria Humphrey 03/19/2015, 8:47 AM

## 2015-03-19 NOTE — MAU Note (Signed)
Pt for d/c.  Not in lobby

## 2015-03-19 NOTE — MAU Note (Signed)
C/o vaginal bleeding and R sided pain since Friday afternoon; has IUD and has not had a period since August;

## 2015-03-20 LAB — GC/CHLAMYDIA PROBE AMP (~~LOC~~) NOT AT ARMC
Chlamydia: NEGATIVE
Neisseria Gonorrhea: NEGATIVE

## 2015-05-02 IMAGING — US US OB FOLLOW-UP
1 series · 12 of 28 positions shown · non-contrast
Comparison: none

[Series 1: us ob follow-up · 0.23mm/px · 12 of 52 slices shown]
[im 2/52]
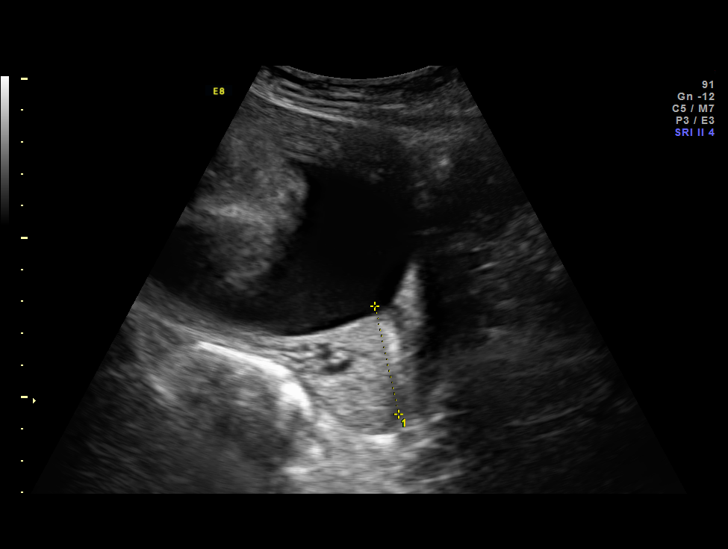
[im 6/52]
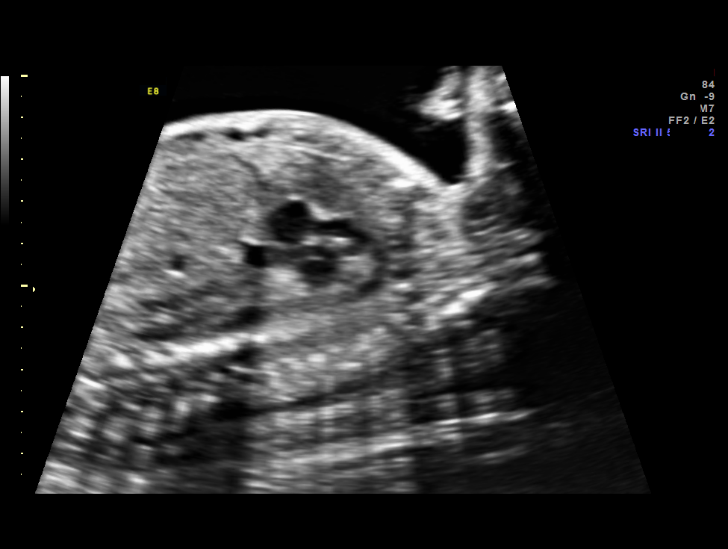
[im 10/52]
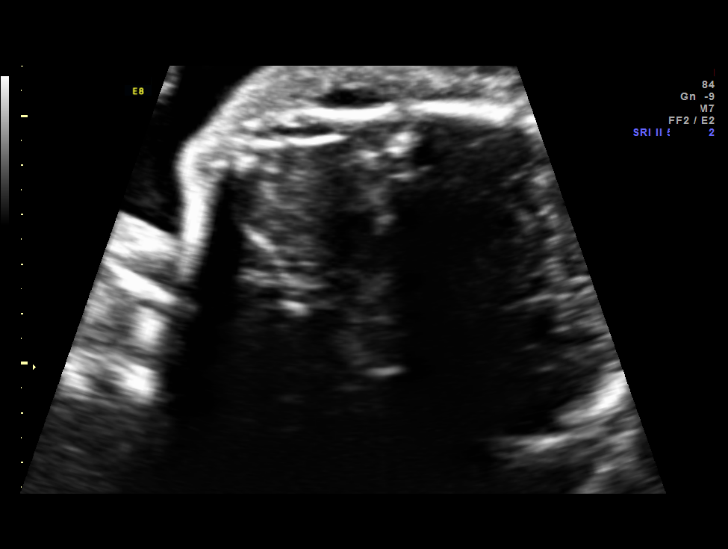
[im 16/52]
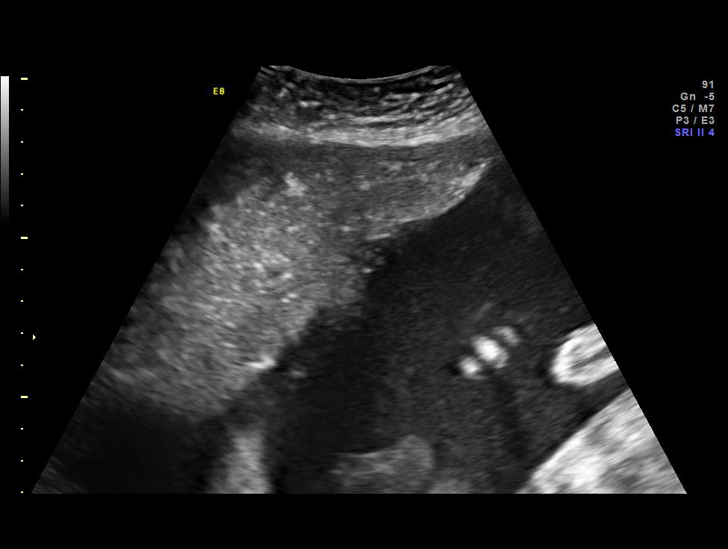
[im 19/52]
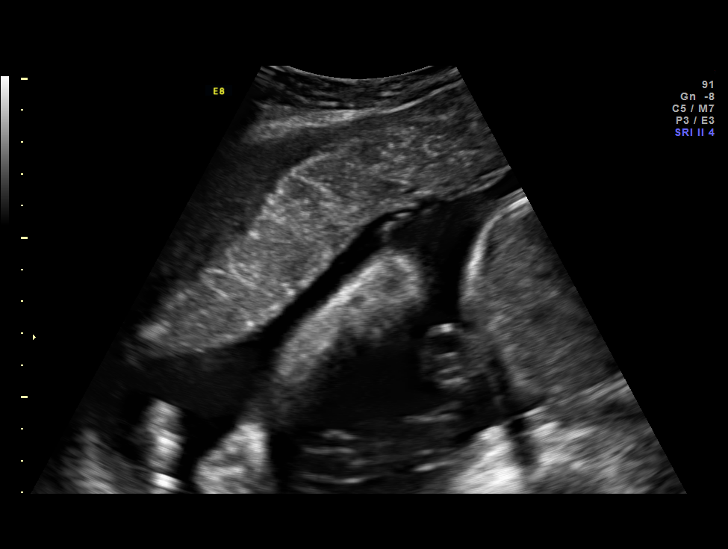
[im 23/52]
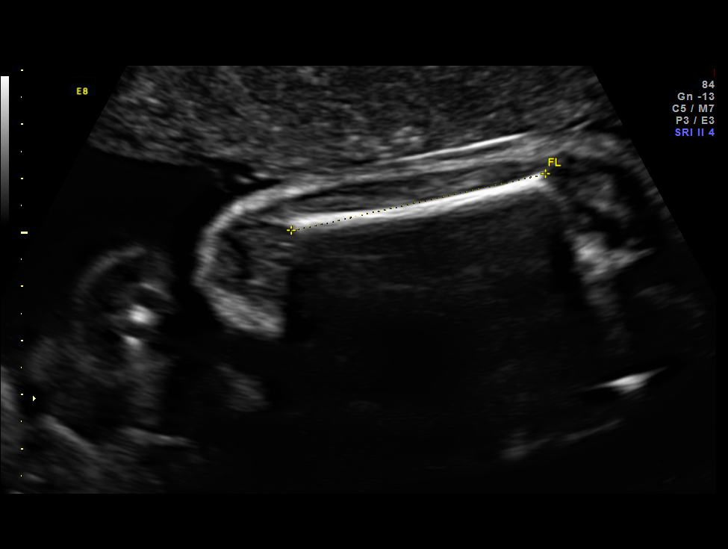
[im 29/52]
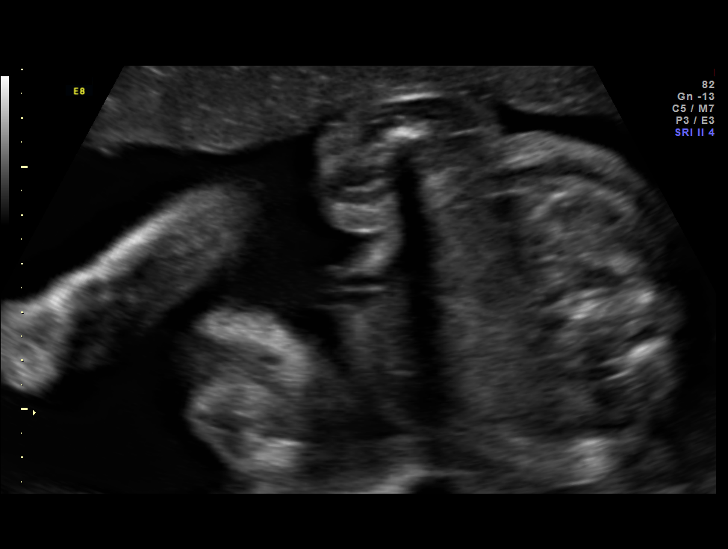
[im 33/52]
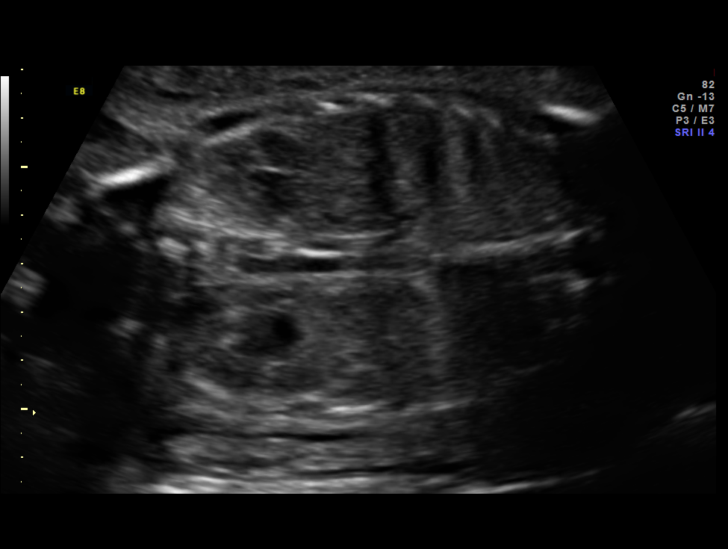
[im 36/52]
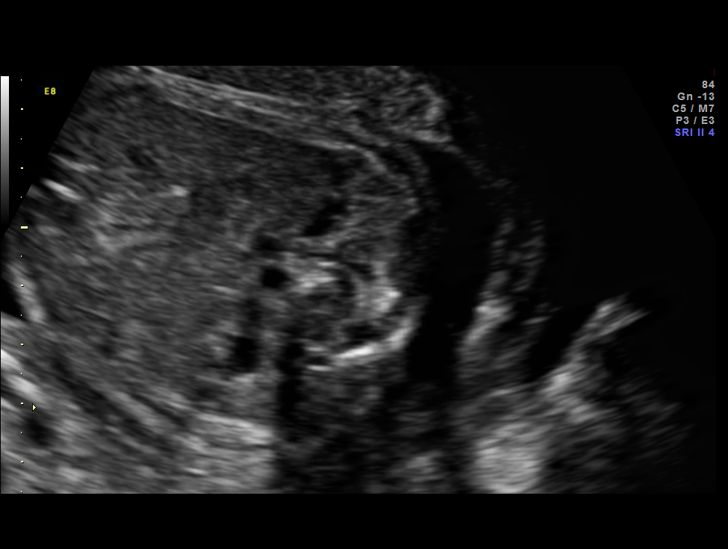
[im 42/52]
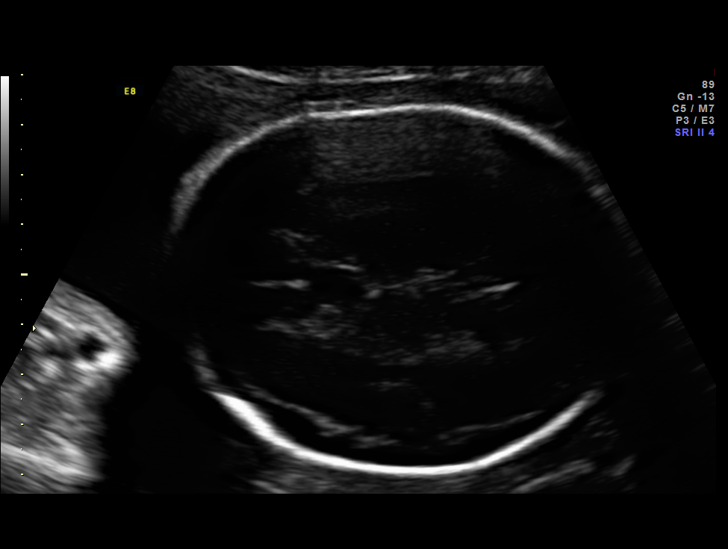
[im 46/52]
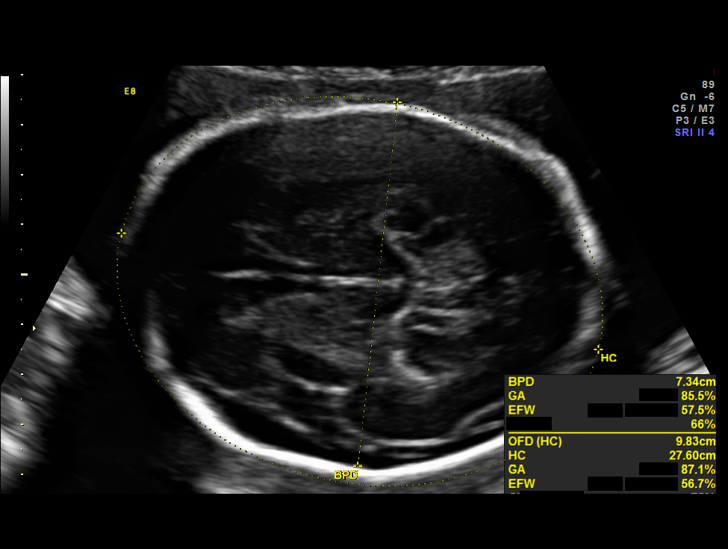
[im 50/52]
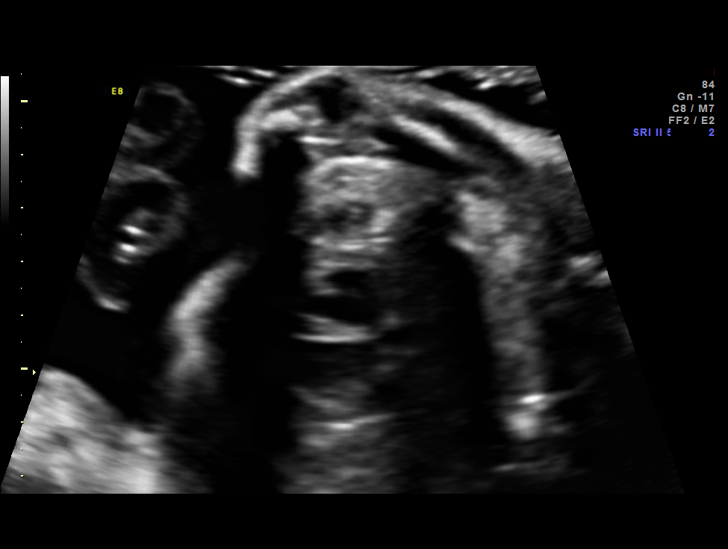

[12 of 28 positions shown; findings below may reference images not displayed]

OBSTETRICS REPORT
                      (Signed Final 11/28/2013 [DATE])

Service(s) Provided

 US OB FOLLOW UP                                       76816.1
Indications

 Fetal abnormality - other known or suspected (
 choriod plexus cyst, renal pyelectasis)
 Follow-up incomplete fetal anatomic evaluation
Fetal Evaluation

 Num Of Fetuses:    1
 Fetal Heart Rate:  147                          bpm
 Cardiac Activity:  Observed
 Presentation:      Cephalic
 Placenta:          Anterior, above cervical os
 P. Cord            Previously Visualized
 Insertion:

 Amniotic Fluid
 AFI FV:      Subjectively within normal limits
                                             Larg Pckt:     7.9  cm
Biometry

 BPD:     73.2  mm     G. Age:  29w 3d                CI:         73.3   70 - 86
 OFD:     99.8  mm                                    FL/HC:      17.4   18.8 -

 HC:     278.1  mm     G. Age:  30w 3d       91  %    HC/AC:      1.12   1.05 -

 AC:     248.2  mm     G. Age:  29w 0d       77  %    FL/BPD:     66.0   71 - 87
 FL:      48.3  mm     G. Age:  26w 2d        5  %    FL/AC:      19.5   20 - 24
 HUM:     45.5  mm     G. Age:  26w 6d       25  %

 Est. FW:    3372  gm    2 lb 10 oz      59  %
Gestational Age

 LMP:           27w 6d        Date:  05/17/13                 EDD:   02/21/14
 U/S Today:     28w 6d                                        EDD:   02/14/14
 Best:          27w 6d     Det. By:  LMP  (05/17/13)          EDD:   02/21/14
Anatomy

 Cranium:          Appears normal         Aortic Arch:      Appears normal
 Fetal Cavum:      Appears normal         Ductal Arch:      Appears normal
 Ventricles:       Appears normal         Diaphragm:        Appears normal
 Choroid Plexus:   Previously seen        Stomach:          Appears normal, left
                                                            sided
 Cerebellum:       Appears normal         Abdomen:          Appears normal
 Posterior Fossa:  Appears normal         Abdominal Wall:   Appears nml (cord
                                                            insert, abd wall)
 Nuchal Fold:      Not applicable (>20    Cord Vessels:     Previously seen
                   wks GA)
 Face:             Appears normal         Kidneys:          Appear normal
                   (orbits and profile)
 Lips:             Appears normal         Bladder:          Appears normal
 Palate:           Appears normal         Spine:            Previously seen
 Heart:            Appears normal         Lower             Previously seen
                   (4CH, axis, and        Extremities:
                   situs)
 RVOT:             Appears normal         Upper             Previously seen
                                          Extremities:
 LVOT:             Appears normal

 Other:  Male gender. Heels and 5th digit previously seen.
Cervix Uterus Adnexa

 Cervical Length:    3.5      cm

 Cervix:       Normal appearance by transabdominal scan. Appears
               closed, without funnelling.
Impression

 SIUP at 27+6 weeks
 Normal interval anatomy; anatomic survey complete
 Normal amniotic fluid volume
 Appropriate interval growth with EFW at the 59th %tile
Recommendations

 Follow-up as clinically indicated

 questions or concerns.

## 2016-08-24 ENCOUNTER — Ambulatory Visit: Payer: Commercial Indemnity | Admitting: Obstetrics

## 2018-02-01 ENCOUNTER — Other Ambulatory Visit (HOSPITAL_COMMUNITY)
Admission: RE | Admit: 2018-02-01 | Discharge: 2018-02-01 | Disposition: A | Discharge status: Home / Self Care | Payer: Commercial Indemnity | Source: Ambulatory Visit | Attending: Obstetrics | Admitting: Obstetrics

## 2018-02-01 ENCOUNTER — Other Ambulatory Visit: Payer: Self-pay

## 2018-02-01 ENCOUNTER — Encounter: Payer: Self-pay | Admitting: Obstetrics

## 2018-02-01 ENCOUNTER — Ambulatory Visit (INDEPENDENT_AMBULATORY_CARE_PROVIDER_SITE_OTHER): Payer: Commercial Indemnity | Admitting: Obstetrics

## 2018-02-01 VITALS — BP 106/70 | HR 63 | Ht 62.0 in | Wt 130.7 lb

## 2018-02-01 DIAGNOSIS — Z30432 Encounter for removal of intrauterine contraceptive device: Secondary | ICD-10-CM

## 2018-02-01 DIAGNOSIS — Z3009 Encounter for other general counseling and advice on contraception: Secondary | ICD-10-CM

## 2018-02-01 DIAGNOSIS — Z01419 Encounter for gynecological examination (general) (routine) without abnormal findings: Secondary | ICD-10-CM

## 2018-02-01 DIAGNOSIS — Z30011 Encounter for initial prescription of contraceptive pills: Secondary | ICD-10-CM | POA: Diagnosis not present

## 2018-02-01 DIAGNOSIS — N898 Other specified noninflammatory disorders of vagina: Secondary | ICD-10-CM

## 2018-02-01 MED ORDER — LEVONORGEST-ETH ESTRADIOL-IRON 0.1-20 MG-MCG(21) PO TABS: 1.0000 | ORAL_TABLET | Freq: Every day | ORAL | 11 refills | Status: AC

## 2018-02-01 NOTE — Progress Notes (Signed)
Presents for AEX/PAP/STD.  Wants IUD removed at a later date; thinking about OCP. On Zoloft for Anxiety Disorder.

## 2018-02-01 NOTE — Progress Notes (Signed)
Subjective:        Victoria Humphrey is a 27 y.o. female here for a routine exam.  Current complaints: None.    Personal health questionnaire:  Is patient Ashkenazi Jewish, have a family history of breast and/or ovarian cancer: no Is there a family history of uterine cancer diagnosed at age < 63, gastrointestinal cancer, urinary tract cancer, family member who is a Personnel officer syndrome-associated carrier: no Is the patient overweight and hypertensive, family history of diabetes, personal history of gestational diabetes, preeclampsia or PCOS: no Is patient over 45, have PCOS,  family history of premature CHD under age 55, diabetes, smoke, have hypertension or peripheral artery disease:  no At any time, has a partner hit, kicked or otherwise hurt or frightened you?: no Over the past 2 weeks, have you felt down, depressed or hopeless?: no Over the past 2 weeks, have you felt little interest or pleasure in doing things?:no   Gynecologic History No LMP recorded (lmp unknown). (Menstrual status: IUD). Contraception: IUD Last Pap: 2014. Results were: normal Last mammogram: n/a. Results were: n/a  Obstetric History OB History  Gravida Para Term Preterm AB Living  SAB TAB Ectopic Multiple Live Births  1       3    # Outcome Date GA Lbr Len/2nd Weight Sex Delivery Anes PTL Lv  4 Term 02/14/14 [redacted]w[redacted]d 07:59 / 00:02 9 lb 2.7 oz (4.159 kg) M Vag-Spont Local  LIV  3 Term 11/03/11 [redacted]w[redacted]d 04:35 / 00:18 9 lb 4 oz (4.196 kg) M Vag-Spont None  LIV     Birth Comments: WNL  2 Preterm 2011 [redacted]w[redacted]d  7 lb 6 oz (3.345 kg) M Vag-Spont   LIV  1 SAB             Past Medical History:  Diagnosis Date  . Hx of chlamydia infection 03/2013   treated  . Migraine headache   . Preterm labor     Past Surgical History:  Procedure Laterality Date  . DILATION AND CURETTAGE OF UTERUS       Current Outpatient Medications:  .  levonorgestrel (MIRENA) 20 MCG/24HR IUD, 1 each by Intrauterine route once. Pt had  placed in august 2015., Disp: , Rfl:  .  sertraline (ZOLOFT) 50 MG tablet, , Disp: , Rfl:  .  folic acid (FOLVITE) 400 MCG tablet, Take 400 mcg by mouth daily., Disp: , Rfl:  .  ibuprofen (ADVIL,MOTRIN) 600 MG tablet, Take 1 tablet (600 mg total) by mouth every 6 (six) hours as needed. (Patient not taking: Reported on 03/19/2015), Disp: 30 tablet, Rfl: 0 .  ibuprofen (ADVIL,MOTRIN) 800 MG tablet, Take 1 tablet (800 mg total) by mouth 3 (three) times daily. (Patient not taking: Reported on 02/01/2018), Disp: 21 tablet, Rfl: 0 .  Levonorgest-Eth Estrad-Fe Bisg (BALCOLTRA) 0.1-20 MG-MCG(21) TABS, Take 1 tablet by mouth daily before breakfast., Disp: 28 tablet, Rfl: 11 .  Omega-3 Fatty Acids (FISH OIL PO), Take 1 capsule by mouth daily., Disp: , Rfl:  No Known Allergies  Social History   Tobacco Use  . Smoking status: Never Smoker  . Smokeless tobacco: Never Used  Substance Use Topics  . Alcohol use: No    Family History  Problem Relation Age of Onset  . Anesthesia problems Neg Hx   . Diabetes Father   . Birth defects Brother        spina bifida  . Diabetes Paternal Aunt   . Diabetes  Paternal Uncle   . Cancer Paternal Grandmother        breast and thyroid, stomach  . Diabetes Paternal Grandmother       Review of Systems  Constitutional: negative for fatigue and weight loss Respiratory: negative for cough and wheezing Cardiovascular: negative for chest pain, fatigue and palpitations Gastrointestinal: negative for abdominal pain and change in bowel habits Musculoskeletal:negative for myalgias Neurological: negative for gait problems and tremors Behavioral/Psych: negative for abusive relationship, depression Endocrine: negative for temperature intolerance    Genitourinary:negative for abnormal menstrual periods, genital lesions, hot flashes, sexual problems and vaginal discharge Integument/breast: negative for breast lump, breast tenderness, nipple discharge and skin lesion(s)     Objective:       BP 106/70   Pulse 6 [id number]$  (1.575 m)   Wt 130 lb 11.2 oz (59.3 kg)   LMP  (LMP Unknown)   BMI 23.91 kg/m  General:   alert  Skin:   no rash or abnormalities  Lungs:   clear to auscultation bilaterally  Heart:   regular rate and rhythm, S1, S2 normal, no murmur, click, rub or gallop  Breasts:   normal without suspicious masses, skin or nipple changes or axillary nodes  Abdomen:  normal findings: no organomegaly, soft, non-tender and no hernia  Pelvis:  External genitalia: normal general appearance Urinary system: urethral meatus normal and bladder without fullness, nontender Vaginal: normal without tenderness, induration or masses Cervix: normal appearance Adnexa: normal bimanual exam Uterus: anteverted and non-tender, normal size   Lab Review Urine pregnancy test Labs reviewed yes Radiologic studies reviewed no  50% of 20 min visit spent on counseling and coordination of care.   Assessment:     1. Encounter for routine gynecological examination with Papanicolaou smear of cervix Rx: - Cytology - PAP  2. Vaginal discharge Rx: - Cervicovaginal ancillary only  3. Encounter for other general counseling and advice on contraception - wants IUD removed because she would like to have regular periods - would like to start OCP's   4. Encounter for IUD removal  5. Encounter for initial prescription of contraceptive pills Rx: - Levonorgest-Eth Estrad-Fe Bisg (BALCOLTRA) 0.1-20 MG-MCG(21) TABS; Take 1 tablet by mouth daily before breakfast.  Dispense: 28 tablet; Refill: 11     Plan:    Education reviewed: calcium supplements, depression evaluation, low fat, low cholesterol diet, safe sex/STD prevention, self breast exams and weight bearing exercise. Contraception: OCP (estrogen/progesterone). Follow up in: 1 year.   Meds ordered this encounter  Medications  . Levonorgest-Eth Estrad-Fe Bisg (BALCOLTRA) 0.1-20 MG-MCG(21) TABS    Sig: Take 1 tablet  by mouth daily before breakfast.    Dispense:  28 tablet    Refill:  11   No orders of the defined types were placed in this encounter.   [name] MD 02-01-2018

## 2018-02-02 LAB — CERVICOVAGINAL ANCILLARY ONLY
Bacterial vaginitis: NEGATIVE
Candida vaginitis: NEGATIVE
Chlamydia: NEGATIVE
Neisseria Gonorrhea: NEGATIVE
Trichomonas: NEGATIVE

## 2018-02-03 LAB — CYTOLOGY - PAP: Diagnosis: NEGATIVE

## 2018-04-04 ENCOUNTER — Ambulatory Visit: Payer: Managed Care, Other (non HMO) | Admitting: Obstetrics

## 2018-08-16 ENCOUNTER — Ambulatory Visit (INDEPENDENT_AMBULATORY_CARE_PROVIDER_SITE_OTHER): Payer: Managed Care, Other (non HMO) | Admitting: Obstetrics

## 2018-08-16 ENCOUNTER — Encounter: Payer: Self-pay | Admitting: Obstetrics

## 2018-08-16 ENCOUNTER — Other Ambulatory Visit (HOSPITAL_COMMUNITY)
Admission: RE | Admit: 2018-08-16 | Discharge: 2018-08-16 | Disposition: A | Discharge status: Home / Self Care | Payer: Managed Care, Other (non HMO) | Source: Ambulatory Visit | Attending: Obstetrics | Admitting: Obstetrics

## 2018-08-16 VITALS — BP 114/72 | HR 96 | Wt 136.4 lb

## 2018-08-16 DIAGNOSIS — Z30430 Encounter for insertion of intrauterine contraceptive device: Secondary | ICD-10-CM | POA: Diagnosis not present

## 2018-08-16 DIAGNOSIS — Z3202 Encounter for pregnancy test, result negative: Secondary | ICD-10-CM

## 2018-08-16 DIAGNOSIS — Z3009 Encounter for other general counseling and advice on contraception: Secondary | ICD-10-CM

## 2018-08-16 DIAGNOSIS — N760 Acute vaginitis: Secondary | ICD-10-CM | POA: Diagnosis not present

## 2018-08-16 DIAGNOSIS — B9689 Other specified bacterial agents as the cause of diseases classified elsewhere: Secondary | ICD-10-CM

## 2018-08-16 DIAGNOSIS — Z30014 Encounter for initial prescription of intrauterine contraceptive device: Secondary | ICD-10-CM

## 2018-08-16 DIAGNOSIS — N898 Other specified noninflammatory disorders of vagina: Secondary | ICD-10-CM | POA: Insufficient documentation

## 2018-08-16 LAB — POCT URINE PREGNANCY: Preg Test, Ur: NEGATIVE

## 2018-08-16 MED ORDER — LEVONORGESTREL 19.5 MG IU IUD
1.0000 | INTRAUTERINE_SYSTEM | Freq: Once | INTRAUTERINE | Status: AC
  Administered 2018-08-16: 19.5 mg via INTRAUTERINE

## 2018-08-16 MED ORDER — CLINDAMYCIN HCL 300 MG PO CAPS: 300.0000 mg | ORAL_CAPSULE | Freq: Three times a day (TID) | ORAL | 0 refills | Status: DC

## 2018-08-16 NOTE — Progress Notes (Signed)
Subjective:    Victoria Humphrey is a 27 y.o. female who presents for contraception counseling. The patient has no complaints today. The patient is not currently sexually active. Pertinent past medical history: none.  The information documented in the HPI was reviewed and verified.  Menstrual History: OB History    Gravida  4   Para  3   Term  2   Preterm  1   AB  1   Living  3     SAB  1   TAB      Ectopic      Multiple      Live Births  3            Patient's last menstrual period was 08/10/2018.   Patient Active Problem List   Diagnosis Date Noted  . Depression affecting pregnancy, postpartum 03/13/2014  . History of postpartum depression 02/16/2014  . Pregnancy 02/14/2014  . Normal delivery 02/14/2014  . Gestational diabetes 01/10/2014  . Low backache 01/10/2014  . Rh negative state in antepartum period 12/29/2013  . Preterm labor in second trimester without delivery 12/28/2013  . Backache 11/14/2013  . Choroid plexus cyst of fetus on prenatal ultrasound 10/11/2013  . Pain 08/02/2013  . HA (headache) 08/02/2013  . Unspecified high-risk pregnancy 07/10/2013  . Personal history of pre-term labor 07/10/2013  . Hx of preterm delivery, currently pregnant 07/10/2013  . Nausea and vomiting in pregnancy prior to [redacted] weeks gestation 07/10/2013  . Postmaturity pregnancy, 40-[redacted] weeks gestation 11/03/2011  . Pregnancy headache, antepartum 05/12/2011   Past Medical History:  Diagnosis Date  . Hx of chlamydia infection 03/2013   treated  . Migraine headache   . Preterm labor     Past Surgical History:  Procedure Laterality Date  . DILATION AND CURETTAGE OF UTERUS       Current Outpatient Medications:  .  clindamycin (CLEOCIN) 300 MG capsule, Take 1 capsule (300 mg total) by mouth 3 (three) times daily., Disp: 21 capsule, Rfl: 0 .  folic acid (FOLVITE) 400 MCG tablet, Take 400 mcg by mouth daily., Disp: , Rfl:  .  ibuprofen (ADVIL,MOTRIN) 600 MG tablet, Take 1  tablet (600 mg total) by mouth every 6 (six) hours as needed. (Patient not taking: Reported on 03/19/2015), Disp: 30 tablet, Rfl: 0 .  ibuprofen (ADVIL,MOTRIN) 800 MG tablet, Take 1 tablet (800 mg total) by mouth 3 (three) times daily. (Patient not taking: Reported on 02/01/2018), Disp: 21 tablet, Rfl: 0 .  Levonorgest-Eth Estrad-Fe Bisg (BALCOLTRA) 0.1-20 MG-MCG(21) TABS, Take 1 tablet by mouth daily before breakfast. (Patient not taking: Reported on 08/16/2018), Disp: 28 tablet, Rfl: 11 .  levonorgestrel (MIRENA) 20 MCG/24HR IUD, 1 each by Intrauterine route once. Pt had placed in august 2015., Disp: , Rfl:  .  Omega-3 Fatty Acids (FISH OIL PO), Take 1 capsule by mouth daily., Disp: , Rfl:  .  sertraline (ZOLOFT) 50 MG tablet, , Disp: , Rfl:  No Known Allergies  Social History   Tobacco Use  . Smoking status: Never Smoker  . Smokeless tobacco: Never Used  Substance Use Topics  . Alcohol use: No    Family History  Problem Relation Age of Onset  . Anesthesia problems Neg Hx   . Diabetes Father   . Birth defects Brother        spina bifida  . Diabetes Paternal Aunt   . Diabetes Paternal Uncle   . Cancer Paternal Grandmother  breast and thyroid, stomach  . Diabetes Paternal Grandmother        Review of Systems Constitutional: negative for weight loss Genitourinary:negative for abnormal menstrual periods and vaginal discharge   Objective:   BP 114/72   Pulse 96   Wt 136 lb 6.4 oz (61.9 kg)   LMP 08/10/2018   BMI 24.95 kg/m    General:   alert  Skin:   no rash or abnormalities  Lungs:   clear to auscultation bilaterally  Heart:   regular rate and rhythm, S1, S2 normal, no murmur, click, rub or gallop  Breasts:   normal without suspicious masses, skin or nipple changes or axillary nodes  Abdomen:  normal findings: no organomegaly, soft, non-tender and no hernia  Pelvis:  External genitalia: normal general appearance Urinary system: urethral meatus normal and bladder  without fullness, nontender Vaginal: normal without tenderness, induration or masses Cervix: normal appearance Adnexa: normal bimanual exam Uterus: anteverted and non-tender, normal size   Lab Review Urine pregnancy test Labs reviewed yes Radiologic studies reviewed no  50% of 25 min visit spent on counseling and coordination of care.    Assessment:    27 y.o., starting IUD, no contraindications.   Plan:    All questions answered. Chlamydia specimen. Agricultural engineerducational material distributed. Follow up in 6 weeks. GC specimen. Pregnancy test, result: negative. Wet prep.    Meds ordered this encounter  Medications  . Levonorgestrel IUD 19.5 mg  . clindamycin (CLEOCIN) 300 MG capsule    Sig: Take 1 capsule (300 mg total) by mouth 3 (three) times daily.    Dispense:  21 capsule    Refill:  0   Orders Placed This Encounter  Procedures  . POCT urine pregnancy   Brock BadHARLES A. HARPER MD 08-16-2018     IUD Insertion Procedure Note   Pre-operative Diagnosis: Desires Long Acting Reversible Contraception ( LARC )  Post-operative Diagnosis: same  Indications: contraception  Procedure Details  Urine pregnancy test was done in office and result was negative.  The risks (including infection, bleeding, pain, and uterine perforation) and benefits of the procedure were explained to the patient and Written informed consent was obtained.    Cervix cleansed with Betadine. Uterus sounded to 8 cm. IUD inserted without difficulty. String visible and trimmed. Patient tolerated procedure well.  IUD Information: Rutha BouchardKyleena IUD, Lot # [id number],  Expiration date: OCT. 2021  Condition: Stable  Complications: None  Plan:  The patient was advised to call for any fever or for prolonged or severe pain or bleeding. She was advised to use NSAID as needed for mild to moderate pain.   Attending Physician Documentation: I was present for or participated in the entire procedure, including opening and  closing.  Brock BadHARLES A. HARPER MD 08-16-2018

## 2018-08-16 NOTE — Progress Notes (Signed)
[facility] previously [facility], wants to switch to IUD.

## 2018-08-19 LAB — CERVICOVAGINAL ANCILLARY ONLY
Bacterial vaginitis: NEGATIVE
Candida vaginitis: NEGATIVE
Chlamydia: NEGATIVE
Neisseria Gonorrhea: NEGATIVE
Trichomonas: NEGATIVE

## 2019-05-19 ENCOUNTER — Other Ambulatory Visit: Payer: Self-pay

## 2019-05-19 ENCOUNTER — Encounter: Payer: Self-pay | Admitting: Obstetrics & Gynecology

## 2019-05-19 ENCOUNTER — Ambulatory Visit (INDEPENDENT_AMBULATORY_CARE_PROVIDER_SITE_OTHER): Payer: Managed Care, Other (non HMO) | Admitting: Obstetrics & Gynecology

## 2019-05-19 VITALS — BP 102/65 | HR 76 | Wt 147.9 lb

## 2019-05-19 DIAGNOSIS — Z30431 Encounter for routine checking of intrauterine contraceptive device: Secondary | ICD-10-CM | POA: Diagnosis not present

## 2019-05-19 NOTE — Progress Notes (Signed)
Patient is in the office for IUD string check, inserted 08-16-18. Pt states that she cannot feel strings.

## 2019-05-19 NOTE — Patient Instructions (Signed)
Levonorgestrel intrauterine device (IUD) What is this medicine? LEVONORGESTREL IUD (LEE voe nor jes trel) is a contraceptive (birth control) device. The device is placed inside the uterus by a healthcare professional. It is used to prevent pregnancy. This device can also be used to treat heavy bleeding that occurs during your period. This medicine may be used for other purposes; ask your health care provider or pharmacist if you have questions. COMMON BRAND NAME(S): Kyleena, LILETTA, Mirena, Skyla What should I tell my health care provider before I take this medicine? They need to know if you have any of these conditions:  abnormal Pap smear  cancer of the breast, uterus, or cervix  diabetes  endometritis  genital or pelvic infection now or in the past  have more than one sexual partner or your partner has more than one partner  heart disease  history of an ectopic or tubal pregnancy  immune system problems  IUD in place  liver disease or tumor  problems with blood clots or take blood-thinners  seizures  use intravenous drugs  uterus of unusual shape  vaginal bleeding that has not been explained  an unusual or allergic reaction to levonorgestrel, other hormones, silicone, or polyethylene, medicines, foods, dyes, or preservatives  pregnant or trying to get pregnant  breast-feeding How should I use this medicine? This device is placed inside the uterus by a health care professional. Talk to your pediatrician regarding the use of this medicine in children. Special care may be needed. Overdosage: If you think you have taken too much of this medicine contact a poison control center or emergency room at once. NOTE: This medicine is only for you. Do not share this medicine with others. What if I miss a dose? This does not apply. Depending on the brand of device you have inserted, the device will need to be replaced every 3 to 6 years if you wish to continue using this type  of birth control. What may interact with this medicine? Do not take this medicine with any of the following medications:  amprenavir  bosentan  fosamprenavir This medicine may also interact with the following medications:  aprepitant  armodafinil  barbiturate medicines for inducing sleep or treating seizures  bexarotene  boceprevir  griseofulvin  medicines to treat seizures like carbamazepine, ethotoin, felbamate, oxcarbazepine, phenytoin, topiramate  modafinil  pioglitazone  rifabutin  rifampin  rifapentine  some medicines to treat HIV infection like atazanavir, efavirenz, indinavir, lopinavir, nelfinavir, tipranavir, ritonavir  St. John's wort  warfarin This list may not describe all possible interactions. Give your health care provider a list of all the medicines, herbs, non-prescription drugs, or dietary supplements you use. Also tell them if you smoke, drink alcohol, or use illegal drugs. Some items may interact with your medicine. What should I watch for while using this medicine? Visit your doctor or health care professional for regular check ups. See your doctor if you or your partner has sexual contact with others, becomes HIV positive, or gets a sexual transmitted disease. This product does not protect you against HIV infection (AIDS) or other sexually transmitted diseases. You can check the placement of the IUD yourself by reaching up to the top of your vagina with clean fingers to feel the threads. Do not pull on the threads. It is a good habit to check placement after each menstrual period. Call your doctor right away if you feel more of the IUD than just the threads or if you cannot feel the threads at   all. The IUD may come out by itself. You may become pregnant if the device comes out. If you notice that the IUD has come out use a backup birth control method like condoms and call your health care provider. Using tampons will not change the position of the  IUD and are okay to use during your period. This IUD can be safely scanned with magnetic resonance imaging (MRI) only under specific conditions. Before you have an MRI, tell your healthcare provider that you have an IUD in place, and which type of IUD you have in place. What side effects may I notice from receiving this medicine? Side effects that you should report to your doctor or health care professional as soon as possible:  allergic reactions like skin rash, itching or hives, swelling of the face, lips, or tongue  fever, flu-like symptoms  genital sores  high blood pressure  no menstrual period for 6 weeks during use  pain, swelling, warmth in the leg  pelvic pain or tenderness  severe or sudden headache  signs of pregnancy  stomach cramping  sudden shortness of breath  trouble with balance, talking, or walking  unusual vaginal bleeding, discharge  yellowing of the eyes or skin Side effects that usually do not require medical attention (report to your doctor or health care professional if they continue or are bothersome):  acne  breast pain  change in sex drive or performance  changes in weight  cramping, dizziness, or faintness while the device is being inserted  headache  irregular menstrual bleeding within first 3 to 6 months of use  nausea This list may not describe all possible side effects. Call your doctor for medical advice about side effects. You may report side effects to FDA at 1-800-FDA-1088. Where should I keep my medicine? This does not apply. NOTE: This sheet is a summary. It may not cover all possible information. If you have questions about this medicine, talk to your doctor, pharmacist, or health care provider.  2020 Elsevier/Gold Standard (2018-07-19 13:22:01)  

## 2019-05-19 NOTE — Progress Notes (Signed)
  Subjective:     Patient ID: Victoria Humphrey, female   DOB: Oct 29, 1990, 28 y.o.   MRN: [id number] Cc: IUD check [id number] No LMP recorded. (Menstrual status: IUD). She can' t feel the strings of her IUD. We discussed her weight gain since it was placed and I reviewed her recorded weight in the last 5 years. She may need to make dietary change and increase exercise.  Past Medical History:  Diagnosis Date  . Hx of chlamydia infection 03/2013   treated  . Migraine headache   . Preterm labor    Past Surgical History:  Procedure Laterality Date  . DILATION AND CURETTAGE OF UTERUS     No Known Allergies   Review of Systems  Constitutional: Positive for unexpected weight change.  Genitourinary: Negative for menstrual problem, pelvic pain, vaginal bleeding and vaginal discharge.       Objective:   Physical Exam Vitals signs and nursing note reviewed.  Constitutional:      Appearance: Normal appearance. She is normal weight.  Cardiovascular:     Rate and Rhythm: Normal rate.  Pulmonary:     Effort: Pulmonary effort is normal.  Genitourinary:    Comments: Pelvic exam: normal external genitalia, vulva, vagina, cervix, uterus and adnexa, string 3 cm at os and the shaft is not palpable.  Neurological:     Mental Status: She is alert.        Assessment:     IUD in normal position Weight gain 9 lb discussed    Plan:     Voluntary weight loss discussed. She wants to keep the Drue Novel, Alvina Filbert, MD 05/19/2019

## 2019-08-14 DIAGNOSIS — L089 Local infection of the skin and subcutaneous tissue, unspecified: Secondary | ICD-10-CM

## 2019-08-14 DIAGNOSIS — S61452A Open bite of left hand, initial encounter: Secondary | ICD-10-CM

## 2019-08-14 HISTORY — DX: Open bite of left hand, initial encounter: S61.452A

## 2019-08-14 HISTORY — DX: Local infection of the skin and subcutaneous tissue, unspecified: L08.9

## 2019-12-19 ENCOUNTER — Ambulatory Visit: Payer: 59 | Admitting: Adult Health

## 2020-04-02 ENCOUNTER — Ambulatory Visit: Payer: Managed Care, Other (non HMO) | Admitting: Obstetrics

## 2021-02-10 ENCOUNTER — Other Ambulatory Visit: Payer: Self-pay

## 2021-02-10 ENCOUNTER — Encounter (HOSPITAL_BASED_OUTPATIENT_CLINIC_OR_DEPARTMENT_OTHER): Payer: Self-pay | Admitting: *Deleted

## 2021-02-10 ENCOUNTER — Emergency Department (HOSPITAL_BASED_OUTPATIENT_CLINIC_OR_DEPARTMENT_OTHER): Payer: Worker's Compensation

## 2021-02-10 ENCOUNTER — Emergency Department (HOSPITAL_BASED_OUTPATIENT_CLINIC_OR_DEPARTMENT_OTHER)
Admission: EM | Admit: 2021-02-10 | Discharge: 2021-02-10 | Disposition: A | Discharge status: Home / Self Care | Payer: Worker's Compensation | Attending: Emergency Medicine | Admitting: Emergency Medicine

## 2021-02-10 DIAGNOSIS — S41151A Open bite of right upper arm, initial encounter: Secondary | ICD-10-CM

## 2021-02-10 DIAGNOSIS — Y92007 Garden or yard of unspecified non-institutional (private) residence as the place of occurrence of the external cause: Secondary | ICD-10-CM | POA: Insufficient documentation

## 2021-02-10 DIAGNOSIS — S51851A Open bite of right forearm, initial encounter: Secondary | ICD-10-CM | POA: Insufficient documentation

## 2021-02-10 DIAGNOSIS — Y990 Civilian activity done for income or pay: Secondary | ICD-10-CM | POA: Diagnosis not present

## 2021-02-10 DIAGNOSIS — W540XXA Bitten by dog, initial encounter: Secondary | ICD-10-CM | POA: Insufficient documentation

## 2021-02-10 MED ORDER — LIDOCAINE-EPINEPHRINE (PF) 2 %-1:200000 IJ SOLN
20.0000 mL | Freq: Once | INTRAMUSCULAR | Status: AC
  Administered 2021-02-10: 20 mL
  Filled 2021-02-10: qty 20

## 2021-02-10 MED ORDER — IBUPROFEN 800 MG PO TABS
800.0000 mg | ORAL_TABLET | Freq: Once | ORAL | Status: AC
  Administered 2021-02-10: 800 mg via ORAL
  Filled 2021-02-10: qty 1

## 2021-02-10 MED ORDER — AMOXICILLIN-POT CLAVULANATE 875-125 MG PO TABS: 1.0000 | ORAL_TABLET | Freq: Two times a day (BID) | ORAL | 0 refills | Status: DC

## 2021-02-10 MED ORDER — IBUPROFEN 800 MG PO TABS: 800.0000 mg | ORAL_TABLET | Freq: Three times a day (TID) | ORAL | 0 refills | Status: DC | PRN

## 2021-02-10 NOTE — ED Provider Notes (Signed)
[facility] EMERGENCY DEPARTMENT Provider Note   CSN: [id number] Arrival date & time: 02/10/21  1322     History Chief Complaint  Patient presents with  . Animal Bite    Victoria Humphrey is a 30 y.o. female.  She has no significant past medical history.  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  Tried to drag her to the ground.  The dog is not up-to-date on rabies although he has been taking to quarantine.  Patient is up-to-date on her tetanus shot.  She is having moderate abdominal forearm pain worse with movement.  She has no numbness or weakness.  She is right-hand dominant.  The history is provided by the patient.  Animal Bite Contact animal:  Dog Location:  Shoulder/arm Shoulder/arm injury location:  R forearm Pain details:    Quality:  Aching   Severity:  Moderate   Timing:  Constant   Progression:  Unchanged Incident location:  Outside Notifications:  Law enforcement and animal control Animal's rabies vaccination status:  Out of date Animal in possession: yes   Tetanus status:  Up to date Relieved by:  None tried Worsened by:  Activity Ineffective treatments:  None tried Associated symptoms: swelling   Associated symptoms: no fever and no numbness        Past Medical History:  Diagnosis Date  . Hx of chlamydia infection 03/2013   treated  . Migraine headache   . Preterm labor     Patient Active Problem List   Diagnosis Date Noted  . History of postpartum depression 02/16/2014  . Pain 08/02/2013  . Personal history of pre-term labor 07/10/2013    Past Surgical History:  Procedure Laterality Date  . DILATION AND CURETTAGE OF UTERUS       OB History    Gravida  4   Para  3   Term  2   Preterm  1   AB  1   Living  3     SAB  1   IAB      Ectopic      Multiple      Live Births  3           Family History  Problem Relation Age of Onset  . Anesthesia  problems Neg Hx   . Diabetes Father   . Birth defects Brother        spina bifida  . Diabetes Paternal Aunt   . Diabetes Paternal Uncle   . Cancer Paternal Grandmother        breast and thyroid, stomach  . Diabetes Paternal Grandmother     Social History   Tobacco Use  . Smoking status: Never Smoker  . Smokeless tobacco: Never Used  Substance Use Topics  . Alcohol use: No  . Drug use: No    Home Medications Prior to Admission medications   Medication Sig Start Date End Date Taking? Authorizing Provider  clindamycin (CLEOCIN) 300 MG capsule Take 1 capsule (300 mg total) by mouth 3 (three) times daily. Patient not taking: Reported on 05/19/2019 08/16/18   [name], MD  folic acid (FOLVITE) 400 MCG tablet Take 400 mcg by mouth daily.    [provider]  ibuprofen (ADVIL,MOTRIN) 600 MG tablet Take 1 tablet (600 mg total) by mouth every 6 (six) hours as needed. Patient not taking: Reported on 03/19/2015 01/18/15   Sharen Counter  A, CNM  ibuprofen (ADVIL,MOTRIN) 800 MG tablet Take 1 tablet (800 mg total) by mouth 3 (three) times daily. Patient not taking: Reported on 02/01/2018 03/19/15   Jean Rosenthal, NP  Levonorgest-Eth Estrad-Fe Bisg (BALCOLTRA) 0.1-20 MG-MCG(21) TABS Take 1 tablet by mouth daily before breakfast. Patient not taking: Reported on 08/16/2018 02/01/18   [name], MD  levonorgestrel (MIRENA) 20 MCG/24HR IUD 1 each by Intrauterine route once. Pt had placed in august 2015.    [provider]  Omega-3 Fatty Acids (FISH OIL PO) Take 1 capsule by mouth daily.    [provider]  sertraline (ZOLOFT) 50 MG tablet  11/11/17   [provider]    Allergies    Patient has no known allergies.  Review of Systems   Review of Systems  Constitutional: Negative for fever.  Skin: Positive for wound.  Neurological: Negative for weakness and numbness.    Physical Exam Updated Vital Signs BP 113/82   Pulse 61   Temp  98.2 F (36.8 C) (Oral)   Resp 16   Ht 5\' 2"  (1.575 m)   Wt 63.5 kg   SpO2 100%   BMI 25.61 kg/m   Physical Exam Vitals and nursing note reviewed.  Constitutional:      General: She is not in acute distress.    Appearance: Normal appearance. She is well-developed.  HENT:     Head: Normocephalic and atraumatic.  Eyes:     Conjunctiva/sclera: Conjunctivae normal.  Cardiovascular:     Rate and Rhythm: Normal rate and regular rhythm.     Heart sounds: No murmur heard.   Pulmonary:     Effort: Pulmonary effort is normal. No respiratory distress.     Breath sounds: Normal breath sounds.  Abdominal:     Palpations: Abdomen is soft.     Tenderness: There is no abdominal tenderness.  Musculoskeletal:        General: Swelling, tenderness and signs of injury present. Normal range of motion.     Cervical back: Neck supple.     Comments: Laceration on the volar side of her forearm approximately 2 cm and another laceration on the other side of the forearm approximately 2 cm.  There is a few abrasions.  Forearm is diffusely tender.  Elbow is nontender.  Wrist and hand are tender although no wounds.  Radial pulse 2+.  Skin:    General: Skin is warm and dry.     Capillary Refill: Capillary refill takes less than 2 seconds.  Neurological:     General: No focal deficit present.     Mental Status: She is alert.     Sensory: No sensory deficit.     Motor: No weakness.         ED Results / Procedures / Treatments   Labs (all labs ordered are listed, but only abnormal results are displayed) Labs Reviewed - No data to display  EKG None  Radiology DG Forearm Right  Result Date: 02/10/2021 CLINICAL DATA:  Dog bite to the right forearm. EXAM: RIGHT FOREARM - 2 VIEW COMPARISON:  None. FINDINGS: Cortical margins of the radius and ulna are intact. There is no evidence of fracture or other focal bone lesions. Soft tissue air and edema in the mid forearm likely site of dog bite. No  radiopaque foreign body. IMPRESSION: Soft tissue air and edema. No radiopaque foreign body or osseous abnormality. Electronically Signed   By: 02/12/2021 M.D.   On: 02/10/2021 15:19  DG Hand Complete Right  Result Date: 02/10/2021 CLINICAL DATA:  Dog bite. EXAM: RIGHT HAND - COMPLETE 3+ VIEW COMPARISON:  None. FINDINGS: There is no evidence of fracture or dislocation. There is no evidence of arthropathy or other focal bone abnormality. Soft tissues are unremarkable. No soft tissue air or radiopaque foreign body IMPRESSION: Negative radiographs of the right hand. Electronically Signed   By: Narda Rutherford M.D.   On: 02/10/2021 15:18    Procedures Procedures   Medications Ordered in ED Medications  ibuprofen (ADVIL) tablet 800 mg (has no administration in time range)  lidocaine-EPINEPHrine (XYLOCAINE W/EPI) 2 %-1:200000 (PF) injection 20 mL (has no administration in time range)    ED Course  I have reviewed the triage vital signs and the nursing notes.  Pertinent labs & imaging results that were available during my care of the patient were reviewed by me and considered in my medical decision making (see chart for details).  Clinical Course as of 02/10/21 1718  Mon Feb 10, 2021  1454 Wounds were numbed up by me with 2% lighter with epi.  ER tech is irrigating her. [MB]  1501 Does not want sutures placed.  She had a bad experience with another dog bite that got infected.  I think that is a reasonable approach.  We will irrigate out and place a Steri-Strip and on each wound help reapproximate [MB]    Clinical Course User Index [MB] Terrilee Files, MD   MDM Rules/Calculators/A&P                         3 Steri-Strips placed and wound was dressed with nonstick dressing and bulky Kerlix.  Will place on antibiotics for possible infection.  Counseled on signs of infection.  Return instructions discussed.  Final Clinical Impression(s) / ED Diagnoses Final diagnoses:  Dog bite of  right upper extremity, initial encounter    Rx / DC Orders ED Discharge Orders         Ordered    ibuprofen (ADVIL) 800 MG tablet  Every 8 hours PRN        02/10/21 1455    amoxicillin-clavulanate (AUGMENTIN) 875-125 MG tablet  Every 12 hours        02/10/21 1455           Terrilee Files, MD 02/10/21 1721

## 2021-02-10 NOTE — Discharge Instructions (Signed)
You are seen in the emergency department for evaluation of injuries from a dog bite to your right arm.  You had x-rays of your hand wrist and forearm that did not show an obvious fracture.  Your wounds were irrigated and dressed.  You should apply ice to the affected areas.  Soap and water daily.  Finish the antibiotics.  Follow-up with hand surgery.  Return to the emergency department if any worsening or concerning symptoms

## 2021-02-10 NOTE — ED Triage Notes (Addendum)
Brought in by EMS C/o animal bite to right forearm x 30 mins ago, Pt is [employer]

## 2021-02-25 DIAGNOSIS — R002 Palpitations: Secondary | ICD-10-CM | POA: Insufficient documentation

## 2021-02-25 DIAGNOSIS — R000 Tachycardia, unspecified: Secondary | ICD-10-CM | POA: Insufficient documentation

## 2021-02-25 HISTORY — DX: Palpitations: R00.2

## 2021-02-25 HISTORY — DX: Tachycardia, unspecified: R00.0

## 2021-02-26 ENCOUNTER — Ambulatory Visit: Payer: Managed Care, Other (non HMO) | Admitting: Family Medicine

## 2021-05-28 ENCOUNTER — Encounter: Payer: Self-pay | Admitting: Cardiology

## 2021-05-28 ENCOUNTER — Encounter: Payer: Self-pay | Admitting: *Deleted

## 2021-06-13 ENCOUNTER — Encounter: Payer: Self-pay | Admitting: Cardiology

## 2021-06-13 ENCOUNTER — Ambulatory Visit: Payer: Managed Care, Other (non HMO)

## 2021-06-13 ENCOUNTER — Other Ambulatory Visit: Payer: Self-pay

## 2021-06-13 ENCOUNTER — Ambulatory Visit: Payer: Managed Care, Other (non HMO) | Admitting: Cardiology

## 2021-06-13 VITALS — BP 108/70 | HR 100 | Ht 62.0 in | Wt 148.0 lb

## 2021-06-13 DIAGNOSIS — R0609 Other forms of dyspnea: Secondary | ICD-10-CM | POA: Insufficient documentation

## 2021-06-13 DIAGNOSIS — R0600 Dyspnea, unspecified: Secondary | ICD-10-CM | POA: Diagnosis not present

## 2021-06-13 DIAGNOSIS — Z8616 Personal history of COVID-19: Secondary | ICD-10-CM

## 2021-06-13 DIAGNOSIS — R000 Tachycardia, unspecified: Secondary | ICD-10-CM | POA: Diagnosis not present

## 2021-06-13 DIAGNOSIS — R002 Palpitations: Secondary | ICD-10-CM

## 2021-06-13 NOTE — Patient Instructions (Signed)
Medication Instructions:  Your physician recommends that you continue on your current medications as directed. Please refer to the Current Medication list given to you today.  *If you need a refill on your cardiac medications before your next appointment, please call your pharmacy*   Lab Work: None ordered If you have labs (blood work) drawn today and your tests are completely normal, you will receive your results only by: MyChart Message (if you have MyChart) OR A paper copy in the mail If you have any lab test that is abnormal or we need to change your treatment, we will call you to review the results.   Testing/Procedures:  WHY IS MY DOCTOR PRESCRIBING ZIO? The Zio system is proven and trusted by physicians to detect and diagnose irregular heart rhythms -- and has been prescribed to hundreds of thousands of patients.  The FDA has cleared the Zio system to monitor for many different kinds of irregular heart rhythms. In a study, physicians were able to reach a diagnosis 90% of the time with the Zio system1.  You can wear the Zio monitor -- a small, discreet, comfortable patch -- during your normal day-to-day activity, including while you sleep, shower, and exercise, while it records every single heartbeat for analysis.  1Barrett, P., et al. Comparison of 24 Hour Holter Monitoring Versus 14 Day Novel Adhesive Patch Electrocardiographic Monitoring. American Journal of Medicine, 2014.  ZIO VS. HOLTER MONITORING The Zio monitor can be comfortably worn for up to 14 days. Holter monitors can be worn for 24 to 48 hours, limiting the time to record any irregular heart rhythms you may have. Zio is able to capture data for the 51% of patients who have their first symptom-triggered arrhythmia after 48 hours.1  LIVE WITHOUT RESTRICTIONS The Zio ambulatory cardiac monitor is a small, unobtrusive, and water-resistant patch--you might even forget you're wearing it. The Zio monitor records and stores  every beat of your heart, whether you're sleeping, working out, or showering. Wear the monitor for 14 days, remove 06/27/21.   Follow-Up: At [facility], you and your health needs are our priority.  As part of our continuing mission to provide you with exceptional heart care, we have created designated Provider Care Teams.  These Care Teams include your primary Cardiologist (physician) and Advanced Practice Providers (APPs -  Physician Assistants and Nurse Practitioners) who all work together to provide you with the care you need, when you need it.  We recommend signing up for the patient portal called "MyChart".  Sign up information is provided on this After Visit Summary.  MyChart is used to connect with patients for Virtual Visits (Telemedicine).  Patients are able to view lab/test results, encounter notes, upcoming appointments, etc.  Non-urgent messages can be sent to your provider as well.   To learn more about what you can do with MyChart, go to [email or web address].    Your next appointment:   6 week(s)  The format for your next appointment:   In Person  Provider:   Gypsy Balsam, MD   Other Instructions NA

## 2021-06-13 NOTE — Progress Notes (Signed)
Cardiology Consultation:    Date:  06/13/2021   ID:  ALICA SHELLHAMMER, DOB Jan 28, 1991, MRN [id number]  PCP:  [facility]  Cardiologist:  Gypsy Balsam, MD   Referring MD: Tamsen Snider, NP   Chief Complaint  Patient presents with   Palpitations   elevated HR    History of Present Illness:    Victoria Humphrey is a 30 y.o. female who is being seen today for the evaluation of palpitations/tachycardia at the request of Tamsen Snider, NP.  She is an Lexicographer.  In January she suffered from COVID-19 infection couple months later he started experiencing palpitations.  What she mean by that is the heart speeding up.  She will feel her heart speeding up typically onset is abrupt lasting for few minutes and offset gradual.  She does not feel well when she have it, sometimes she feels some uneasy sensation in the chest when it happens.  Some shortness of breath.  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  She walks on the treadmill in full uniform.  She feels her heart speeding up overall she feels exhausted.  She does have 2 jobs she have 3 boys that she is taking care of she lives with the family and relationship is not good.  She described to be under a lot of stress.  She is seen another cardiologist here in town who did echocardiogram which apparently was normal I do not have official report of this yet, she also wore a monitor however Zio patch fell off after 3 hours she also wears some Holter monitor apparently which did not show anything alarming.  But she still very troubled by her arrhythmia and she would like to have investigation done about that.  Apparently thyroid has been checked, she does not smoke does not drink does not use drugs she does have difficulty sleeping and taking some medication to help her to sleep.  Does not use too much coffee.  Past Medical  History:  Diagnosis Date   Animal bite of left hand with infection 08/14/2019   Formatting of this note might be different from the original. Added automatically from request for surgery [id number]   History of postpartum depression 02/16/2014   Monitor PP   Hx of chlamydia infection 03/2013   treated   Migraine headache    Pain 08/02/2013   Palpitations 02/25/2021   Personal history of pre-term labor 07/10/2013   Preterm labor    Tachycardia 02/25/2021    Past Surgical History:  Procedure Laterality Date   DILATION AND CURETTAGE OF UTERUS      Current Medications: Current Meds  Medication Sig   levonorgestrel (MIRENA) 20 MCG/24HR IUD 1 each by Intrauterine route once. Pt had placed in august 2015.   mirtazapine (REMERON) 15 MG tablet Take 15 mg by mouth at bedtime.   venlafaxine XR (EFFEXOR-XR) 37.5 MG 24 hr capsule Take 37.5 mg by mouth daily.     Allergies:   Patient has no known allergies.   Social History   Socioeconomic History   Marital status: Divorced    Spouse name: Not on file   Number of children: Not on file   Years of education: Not on file   Highest education level: Not on file  Occupational History   Not on file  Tobacco Use   Smoking  status: Never   Smokeless tobacco: Never  Substance and Sexual Activity   Alcohol use: No   Drug use: No   Sexual activity: Yes    Partners: Male    Birth control/protection: I.U.D.  Other Topics Concern   Not on file  Social History Narrative   Not on file   Social Determinants of Health   Financial Resource Strain: Not on file  Food Insecurity: Not on file  Transportation Needs: Not on file  Physical Activity: Not on file  Stress: Not on file  Social Connections: Not on file     Family History: The patient's family history includes Birth defects in her brother; Cancer in her paternal grandmother; Diabetes in her father, paternal aunt, paternal grandmother, and paternal uncle; Heart attack in her maternal  grandfather; Heart murmur in her sister; Hypertrophic cardiomyopathy in her brother. There is no history of Anesthesia problems. ROS:   Please see the history of present illness.    All 14 point review of systems negative except as described per history of present illness.  EKGs/Labs/Other Studies Reviewed:    The following studies were reviewed today: I do not have official report of her echocardiogram I have documentation of her other cardiologist describing echocardiogram is being normal  EKG:  EKG is  ordered today.  The ekg ordered today demonstrates normal sinus rhythm normal P interval normal QS complex duration morphology no ST segment changes  Recent Labs: No results found for requested labs within last 8760 hours.  Recent Lipid Panel No results found for: CHOL, TRIG, HDL, CHOLHDL, VLDL, LDLCALC, LDLDIRECT  Physical Exam:    VS:  BP 108/70 (BP Location: Right Arm, Patient Position: Sitting)   Pulse 100   Ht 5\' 2"  (1.575 m)   Wt 148 lb (67.1 kg)   SpO2 99%   BMI 27.07 kg/m     Wt Readings from Last 3 Encounters:  06/13/21 148 lb (67.1 kg)  12/31/20 150 lb (68 kg)  02/10/21 140 lb (63.5 kg)     GEN:  Well nourished, well developed in no acute distress HEENT: Normal NECK: No JVD; No carotid bruits LYMPHATICS: No lymphadenopathy CARDIAC: RRR, no murmurs, no rubs, no gallops RESPIRATORY:  Clear to auscultation without rales, wheezing or rhonchi  ABDOMEN: Soft, non-tender, non-distended MUSCULOSKELETAL:  No edema; No deformity  SKIN: Warm and dry NEUROLOGIC:  Alert and oriented x 3 PSYCHIATRIC:  Normal affect   ASSESSMENT:    1. Palpitations   2. Tachycardia   3. Dyspnea on exertion   4. History of COVID-19    PLAN:    In order of problems listed above:  Palpitations.  So far no recorded arrhythmia.  She failed to wear Zio patch for (but lasted only 3 days and follow-up.  We did talk in length about what to do with the situation she does have apple watch  that probably have ability to record her EKG so I encouraged her to do that.  I also offered her another Zio patch which she accepted.  We will try to put Zio patch for 2 weeks on her also asked her to press the button if she feels palpitations.  I will call other cardiologist office to get report of her echocardiogram.  Apparently she did have TSH checked already.  I asked her to maintain good sleeping habits however she tells me that she have difficulty doing it. Tachycardia we did talk about potentially using some medications like beta-blocker however I will not  use the medication until have establish diagnosis. History of COVID in January.  Seems to recover completely. She does have very stressful situation at home we did talk about some relaxation techniques.  But she told me straight that she does not have time to do that.  She is taking care of 3 boys.   Medication Adjustments/Labs and Tests Ordered: Current medicines are reviewed at length with the patient today.  Concerns regarding medicines are outlined above.  No orders of the defined types were placed in this encounter.  No orders of the defined types were placed in this encounter.   Signed, Georgeanna Lea, MD, [facility]. 06/13/2021 9:20 AM    [facility]

## 2021-07-03 ENCOUNTER — Other Ambulatory Visit: Payer: Self-pay

## 2021-07-03 ENCOUNTER — Ambulatory Visit (INDEPENDENT_AMBULATORY_CARE_PROVIDER_SITE_OTHER): Payer: Managed Care, Other (non HMO) | Admitting: Obstetrics

## 2021-07-03 ENCOUNTER — Encounter: Payer: Self-pay | Admitting: Obstetrics

## 2021-07-03 VITALS — BP 129/86 | HR 92 | Ht 62.0 in | Wt 153.2 lb

## 2021-07-03 DIAGNOSIS — T8332XA Displacement of intrauterine contraceptive device, initial encounter: Secondary | ICD-10-CM | POA: Diagnosis not present

## 2021-07-03 DIAGNOSIS — Z30431 Encounter for routine checking of intrauterine contraceptive device: Secondary | ICD-10-CM | POA: Diagnosis not present

## 2021-07-03 DIAGNOSIS — R350 Frequency of micturition: Secondary | ICD-10-CM | POA: Diagnosis not present

## 2021-07-03 NOTE — Progress Notes (Signed)
Patient ID: Victoria Humphrey, female   DOB: 1991-03-04, 30 y.o.   MRN: [id number]  Chief Complaint  Patient presents with   Gynecologic Exam    HPI Victoria Humphrey is a 30 y.o. female.  Complains of not being able to feel IUD strings for the past 2 months, and periods have started again when she was not having periods with IUD in place.  The [place] IUD was inserted in 2019. HPI  Past Medical History:  Diagnosis Date   Animal bite of left hand with infection 08/14/2019   Formatting of this note might be different from the original. Added automatically from request for surgery [id number]   History of postpartum depression 02/16/2014   Monitor PP   Hx of chlamydia infection 03/2013   treated   Migraine headache    Pain 08/02/2013   Palpitations 02/25/2021   Personal history of pre-term labor 07/10/2013   Preterm labor    Tachycardia 02/25/2021    Past Surgical History:  Procedure Laterality Date   DILATION AND CURETTAGE OF UTERUS      Family History  Problem Relation Age of Onset   Diabetes Father    Heart murmur Sister    Birth defects Brother        spina bifida   Hypertrophic cardiomyopathy Brother    Heart attack Maternal Grandfather    Cancer Paternal Grandmother        breast and thyroid, stomach   Diabetes Paternal Grandmother    Diabetes Paternal Aunt    Diabetes Paternal Uncle    Anesthesia problems Neg Hx     Social History Social History   Tobacco Use   Smoking status: Never   Smokeless tobacco: Never  Substance Use Topics   Alcohol use: No   Drug use: No    No Known Allergies  Current Outpatient Medications  Medication Sig Dispense Refill   mirtazapine (REMERON) 15 MG tablet Take 15 mg by mouth at bedtime.     venlafaxine XR (EFFEXOR-XR) 37.5 MG 24 hr capsule Take 37.5 mg by mouth daily.     Levonorgest-Eth Estrad-Fe Bisg (BALCOLTRA) 0.1-20 MG-MCG(21) TABS Take 1 tablet by mouth daily before breakfast. (Patient not taking: No sig reported) 28 tablet 11    levonorgestrel (KYLEENA) 19.5 MG IUD 1 each by Intrauterine route once.     levonorgestrel (MIRENA) 20 MCG/24HR IUD 1 each by Intrauterine route once. Pt had placed in august 2015. (Patient not taking: Reported on 07/03/2021)     No current facility-administered medications for this visit.    Review of Systems Review of Systems Constitutional: negative for fatigue and weight loss Respiratory: negative for cough and wheezing Cardiovascular: negative for chest pain, fatigue and palpitations Gastrointestinal: negative for abdominal pain and change in bowel habits Genitourinary: positive for not being able to feel IUD strings Integument/breast: negative for nipple discharge Musculoskeletal:negative for myalgias Neurological: negative for gait problems and tremors Behavioral/Psych: negative for abusive relationship, depression Endocrine: negative for temperature intolerance      Blood pressure 129/86, pulse 92, height 5\' 2"  (1.575 m), weight 153 lb 3.2 oz (69.5 kg), last menstrual period 07/03/2021.  Physical Exam Physical Exam General:   Alert and no distress  Skin:   no rash or abnormalities  Lungs:   clear to auscultation bilaterally  Heart:   regular rate and rhythm, S1, S2 normal, no murmur, click, rub or gallop  The remainder of the physical exam deferred due to patient starting a heavy period.  I have spent a total of 15 minutes of face-to-face time, excluding clinical staff time, reviewing notes and preparing to see patient, ordering tests and/or medications, and counseling the patient.   Data Reviewed Office notes  Assessment     1. Intrauterine contraceptive device threads lost, initial encounter Rx: - US PELVIC COMPLETE WITH TRANSVAGINAL; Future  2. Urinary frequency Rx: - Urine Culture     Plan    Orders Placed This Encounter  Procedures   Urine Culture   US PELVIC COMPLETE WITH TRANSVAGINAL    Standing Status:   Future    Standing Expiration Date:    07/03/2022    Order Specific Question:   Reason for Exam (SYMPTOM  OR DIAGNOSIS REQUIRED)    Answer:   IUD strings not felt.    Order Specific Question:   Preferred imaging location?    Answer:   [facility]        [name], MD 07/03/2021 10:17 AM

## 2021-07-03 NOTE — Progress Notes (Signed)
Pt c/o urinary frequency x 2 weeks  UA inconclusive - too much blood Pt can no longer feel [place] IUD strings x past couple of months.  Victoria Humphrey was inserted 08/16/2018 Normal pap 01/2018 - period started today, heavy VB

## 2021-07-06 LAB — URINE CULTURE

## 2021-07-10 ENCOUNTER — Ambulatory Visit (HOSPITAL_BASED_OUTPATIENT_CLINIC_OR_DEPARTMENT_OTHER): Payer: Managed Care, Other (non HMO)

## 2021-07-10 DIAGNOSIS — Z203 Contact with and (suspected) exposure to rabies: Secondary | ICD-10-CM | POA: Insufficient documentation

## 2021-07-14 ENCOUNTER — Telehealth: Payer: Self-pay | Admitting: *Deleted

## 2021-07-14 NOTE — Telephone Encounter (Signed)
Left voicemail for pt to call us back about monitor she wore in September; has she mailed it back yet?

## 2021-07-18 ENCOUNTER — Ambulatory Visit: Payer: Managed Care, Other (non HMO) | Admitting: Obstetrics

## 2021-07-22 DIAGNOSIS — G43909 Migraine, unspecified, not intractable, without status migrainosus: Secondary | ICD-10-CM | POA: Insufficient documentation

## 2021-07-25 ENCOUNTER — Ambulatory Visit: Payer: Managed Care, Other (non HMO) | Admitting: Cardiology

## 2021-09-05 ENCOUNTER — Ambulatory Visit (INDEPENDENT_AMBULATORY_CARE_PROVIDER_SITE_OTHER): Payer: Managed Care, Other (non HMO) | Admitting: *Deleted

## 2021-09-05 ENCOUNTER — Other Ambulatory Visit: Payer: Self-pay

## 2021-09-05 DIAGNOSIS — N912 Amenorrhea, unspecified: Secondary | ICD-10-CM | POA: Diagnosis not present

## 2021-09-05 DIAGNOSIS — N926 Irregular menstruation, unspecified: Secondary | ICD-10-CM

## 2021-09-05 NOTE — Progress Notes (Signed)
[name] presents today for UPT. She has no unusual complaints. LMP:07/29/21    OBJECTIVE: Appears well, in no apparent distress.  Pt states IUD came out in July 2022 and has had regular cycle since.   OB History     Gravida  4   Para  3   Term  2   Preterm  1   AB  1   Living  3      SAB  1   IAB      Ectopic      Multiple      Live Births  3          Home UPT Result:Negative In-Office UPT result:Negative  ASSESSMENT: Negative pregnancy test  PLAN: Follow up as needed. Pt advised she may home test again in 1-2 weeks.  If positive, call for verification appt. If negative and she misses next cycle, make appt to see provider.

## 2021-09-24 NOTE — Progress Notes (Signed)
Patient was assessed and managed by nursing staff during this encounter. I have reviewed the chart and agree with the documentation and plan. I have also made any necessary editorial changes. ° °Danaya Geddis, MD °09/24/2021 10:43 AM  ° °

## 2021-10-15 ENCOUNTER — Encounter: Payer: Self-pay | Admitting: *Deleted

## 2021-10-15 ENCOUNTER — Telehealth: Payer: Self-pay | Admitting: *Deleted

## 2021-10-15 NOTE — Telephone Encounter (Signed)
TC from patient requesting nausea medication. Non-pregnant patient. MyChart message sent declining RX request and advising to f/u with PCP or urgent care as needed.

## 2021-11-03 ENCOUNTER — Ambulatory Visit: Payer: Managed Care, Other (non HMO)

## 2021-11-20 ENCOUNTER — Encounter: Payer: Managed Care, Other (non HMO) | Admitting: Obstetrics
